# Patient Record
Sex: Male | Born: 1960
Health system: Southern US, Community
[De-identification: ages and names within clinical notes are randomized; demographics above are authoritative.]

## PROBLEM LIST (undated history)

## (undated) DIAGNOSIS — M109 Gout, unspecified: Secondary | ICD-10-CM

## (undated) DIAGNOSIS — I10 Essential (primary) hypertension: Secondary | ICD-10-CM

## (undated) DIAGNOSIS — M1990 Unspecified osteoarthritis, unspecified site: Secondary | ICD-10-CM

## (undated) DIAGNOSIS — E785 Hyperlipidemia, unspecified: Secondary | ICD-10-CM

## (undated) HISTORY — DX: Hyperlipidemia, unspecified: E78.5

## (undated) HISTORY — DX: Essential (primary) hypertension: I10

## (undated) HISTORY — DX: Gout, unspecified: M10.9

## (undated) HISTORY — DX: Unspecified osteoarthritis, unspecified site: M19.90

---

## 2015-07-20 ENCOUNTER — Other Ambulatory Visit: Payer: Self-pay | Admitting: Family Medicine

## 2015-07-20 DIAGNOSIS — N5089 Other specified disorders of the male genital organs: Secondary | ICD-10-CM

## 2015-07-21 LAB — LIPID PANEL
Cholesterol: 243 mg/dL — AB (ref 0–200)
HDL: 200 mg/dL — AB (ref 35–70)
LDL CALC: 165 mg/dL
TRIGLYCERIDES: 174 mg/dL — AB (ref 40–160)

## 2015-07-21 LAB — BASIC METABOLIC PANEL
BUN: 15 mg/dL (ref 4–21)
Creatinine: 1.1 mg/dL (ref 0.6–1.3)
Glucose: 106 mg/dL
Potassium: 4.2 mmol/L (ref 3.4–5.3)
SODIUM: 137 mmol/L (ref 137–147)

## 2015-07-21 LAB — HEPATIC FUNCTION PANEL
ALT: 26 U/L (ref 10–40)
AST: 20 U/L (ref 14–40)
BILIRUBIN, TOTAL: 1.1 mg/dL

## 2015-08-08 ENCOUNTER — Other Ambulatory Visit: Payer: Self-pay | Admitting: Family Medicine

## 2015-08-08 DIAGNOSIS — N5089 Other specified disorders of the male genital organs: Secondary | ICD-10-CM

## 2015-08-11 ENCOUNTER — Ambulatory Visit (INDEPENDENT_AMBULATORY_CARE_PROVIDER_SITE_OTHER): Payer: BLUE CROSS/BLUE SHIELD

## 2015-08-11 ENCOUNTER — Ambulatory Visit: Payer: BLUE CROSS/BLUE SHIELD

## 2015-08-11 DIAGNOSIS — N5089 Other specified disorders of the male genital organs: Secondary | ICD-10-CM

## 2015-08-11 DIAGNOSIS — N433 Hydrocele, unspecified: Secondary | ICD-10-CM | POA: Diagnosis not present

## 2016-01-20 IMAGING — US US ART/VEN ABD/PELV/SCROTUM DOPPLER LTD
1 series · 14 of 25 positions shown · non-contrast
Comparison: No prior.

CLINICAL DATA: Left testicular mass for 2-3 years.

EXAM:
SCROTAL ULTRASOUND
DOPPLER ULTRASOUND OF THE TESTICLES
TECHNIQUE: Complete ultrasound examination of the testicles, epididymis, and
other scrotal structures was performed. Color and spectral Doppler
ultrasound were also utilized to evaluate blood flow to the
testicles.

[Series 1: us art/ven abd/pelv/scrotum doppler ltd · 0.07mm/px · 14 of 76 slices shown]
[im 1/76]
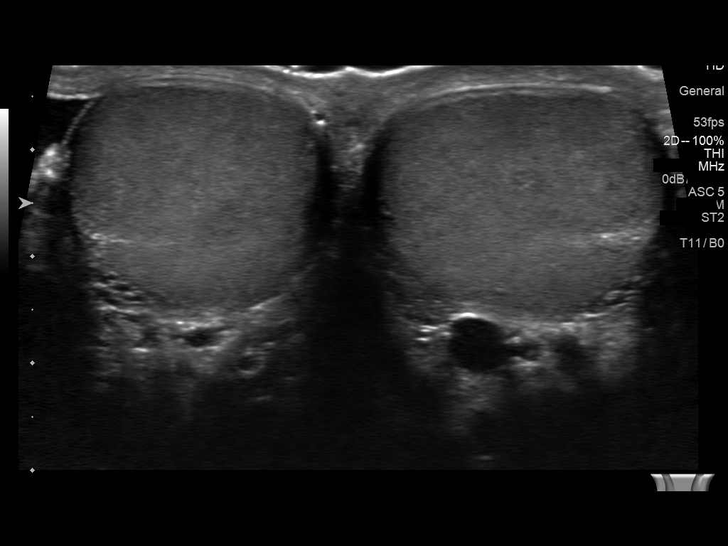
[im 7/76]
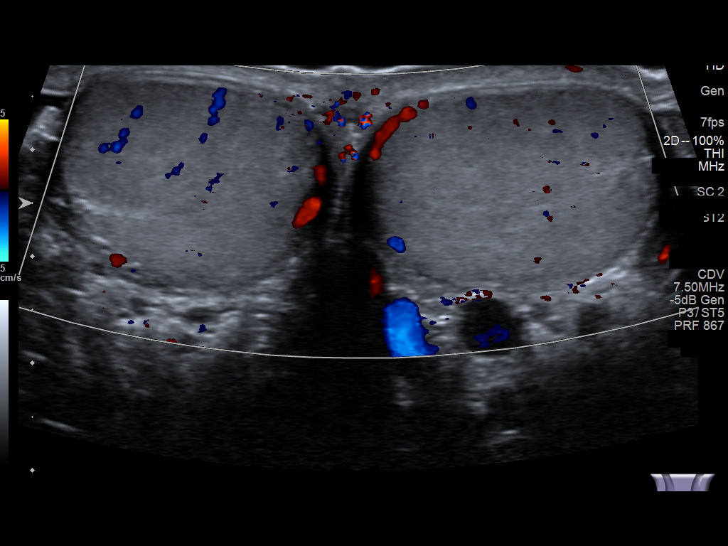
[im 13/76]
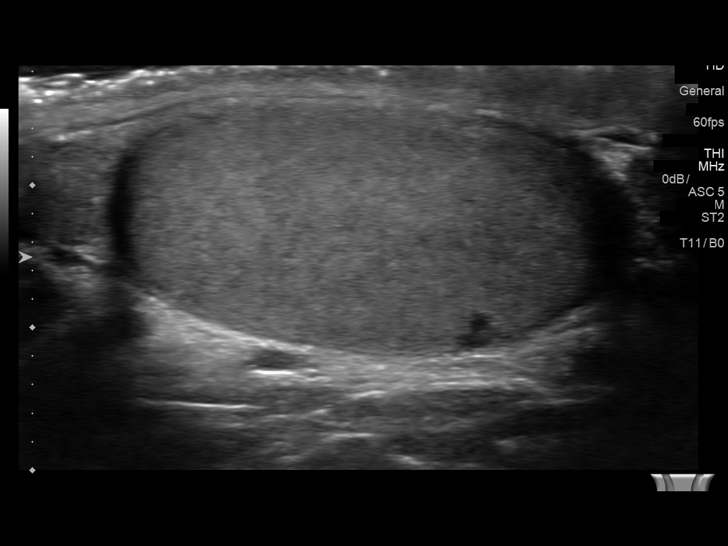
[im 19/76]
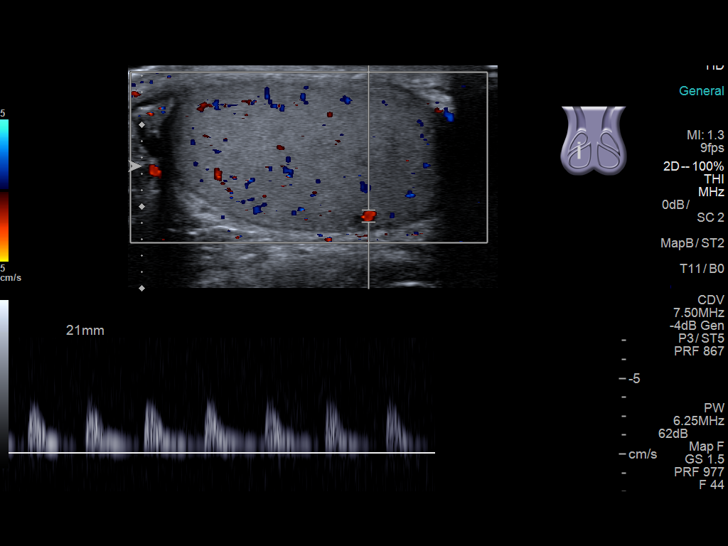
[im 26/76]
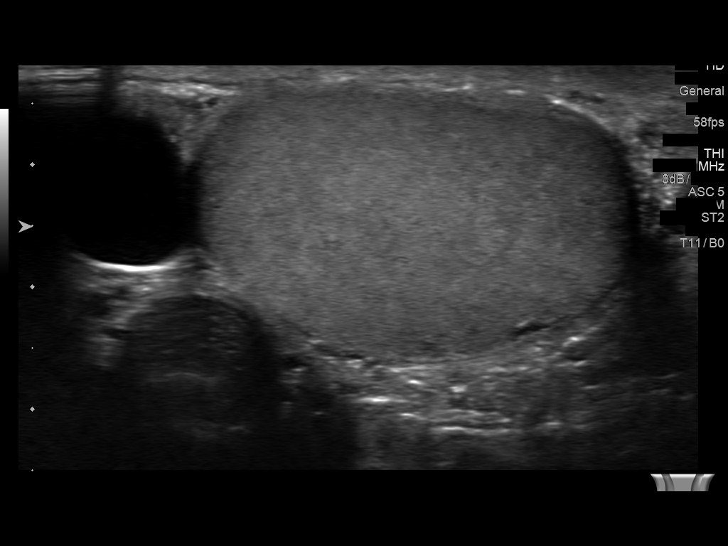
[im 29/76]
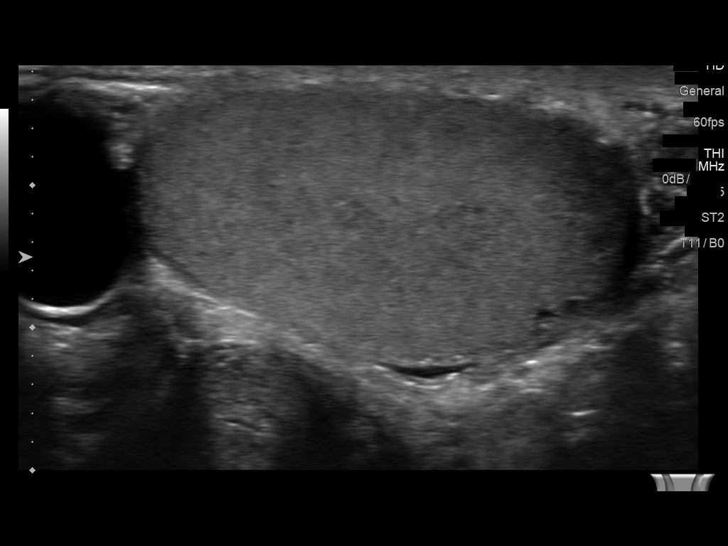
[im 35/76]
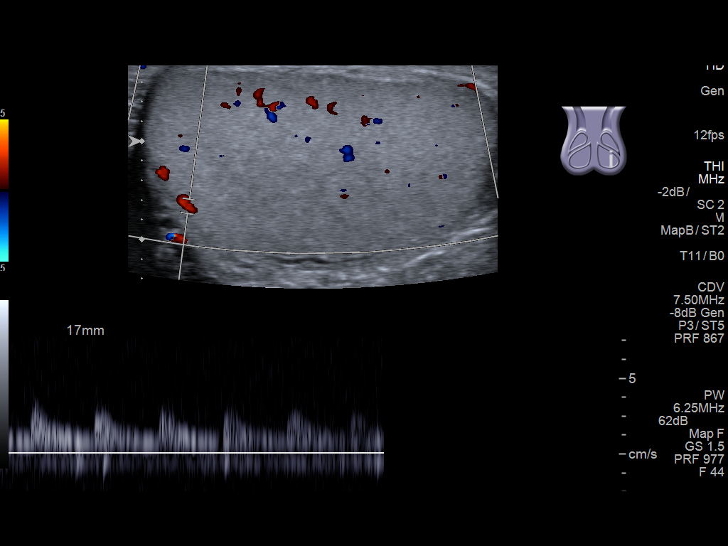
[im 41/76]
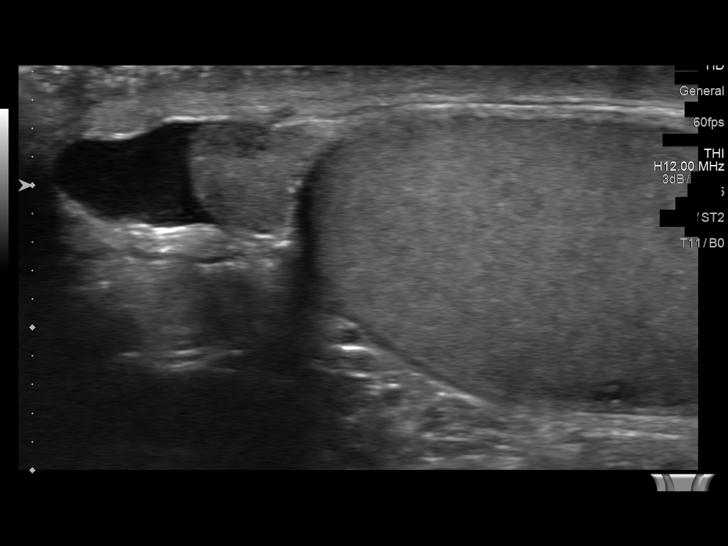
[im 47/76]
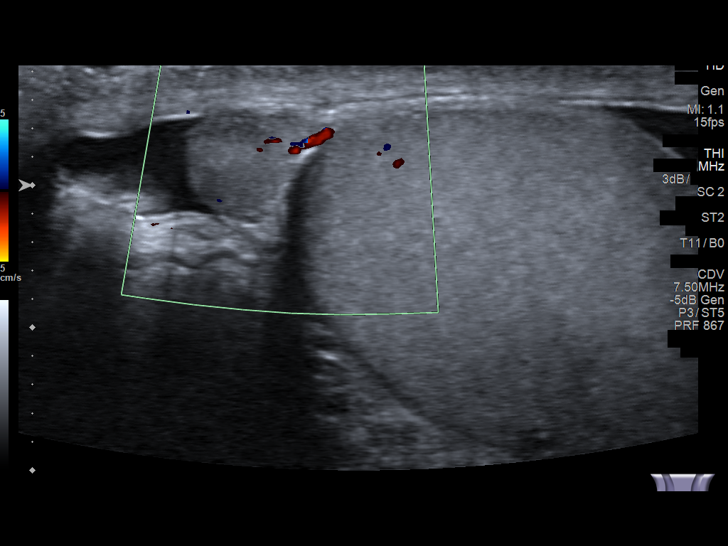
[im 51/76]
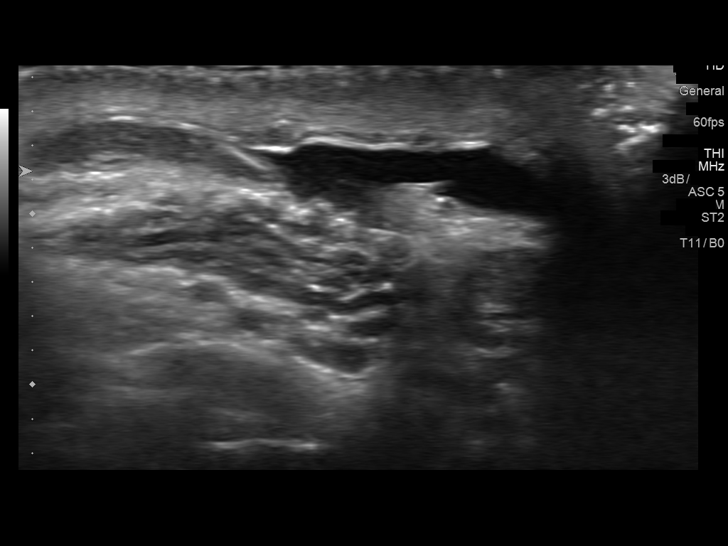
[im 57/76]
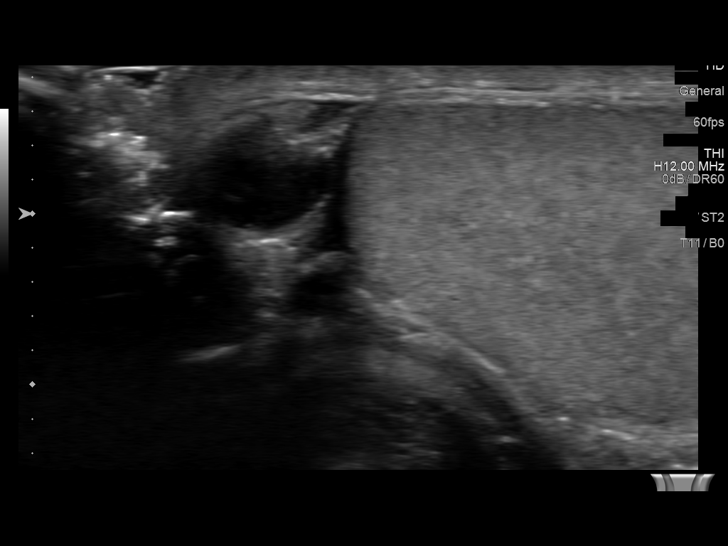
[im 63/76]
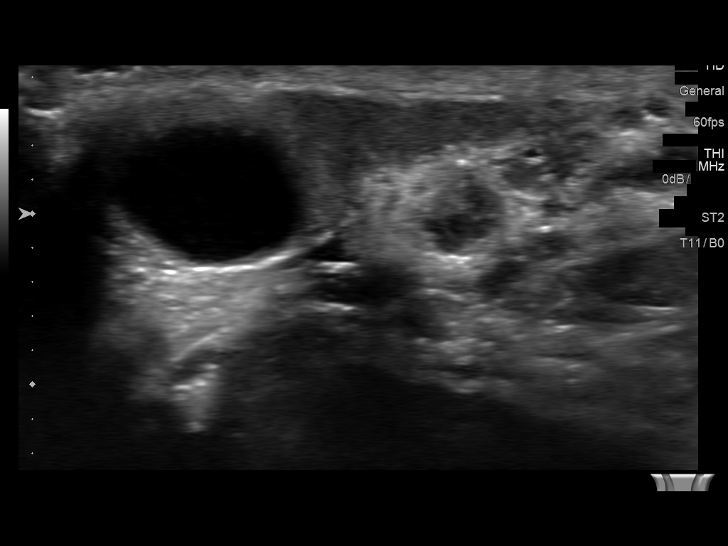
[im 69/76]
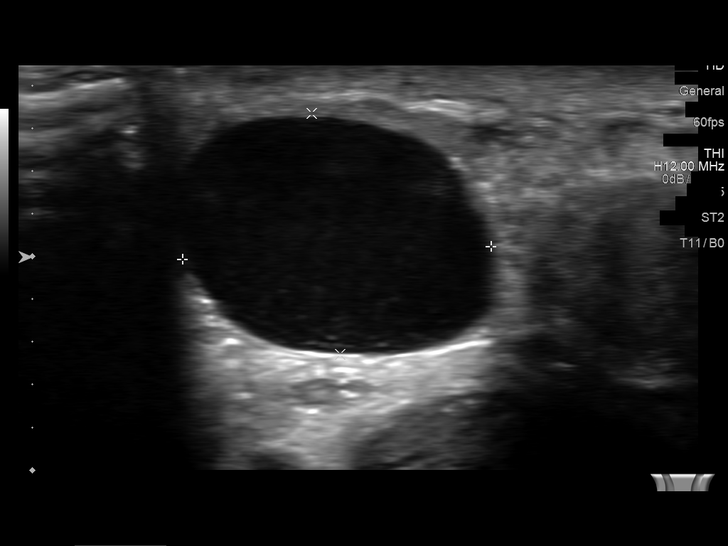
[im 76/76]
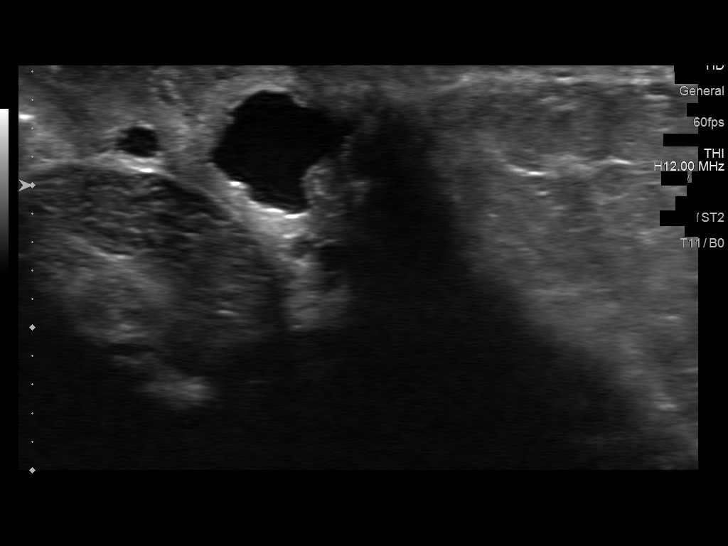

[14 of 25 positions shown; findings below may reference images not displayed]

FINDINGS: Right testicle

Measurements: 3.6 x 2.0 x 2.2 cm. No mass or microlithiasis
visualized.

Left testicle

Measurements: 3.6 x 2.3 x 2.1 cm. No mass or microlithiasis
visualized.

Right epididymis:  Normal in size and appearance.

Left epididymis:  1.5 x 1.1 x 1.4 cm left epididymal cyst.

Hydrocele:  Small hydroceles, right side greater than left.

Varicocele:  None visualized.

Pulsed Doppler interrogation of both testes demonstrates normal low
resistance arterial and venous waveforms bilaterally.
IMPRESSION: 1.  1.5 cm left epididymal cyst.  No evidence of testicular mass.

2.  Small hydroceles, right side greater than left.

## 2016-02-27 ENCOUNTER — Encounter: Payer: Self-pay | Admitting: Family Medicine

## 2016-02-27 ENCOUNTER — Ambulatory Visit (INDEPENDENT_AMBULATORY_CARE_PROVIDER_SITE_OTHER): Payer: BLUE CROSS/BLUE SHIELD | Admitting: Family Medicine

## 2016-02-27 VITALS — BP 148/75 | HR 74 | Temp 97.9°F | Resp 12 | Ht 73.0 in | Wt 209.0 lb

## 2016-02-27 DIAGNOSIS — M10072 Idiopathic gout, left ankle and foot: Secondary | ICD-10-CM | POA: Diagnosis not present

## 2016-02-27 DIAGNOSIS — E785 Hyperlipidemia, unspecified: Secondary | ICD-10-CM | POA: Diagnosis not present

## 2016-02-27 DIAGNOSIS — I10 Essential (primary) hypertension: Secondary | ICD-10-CM

## 2016-02-27 DIAGNOSIS — M109 Gout, unspecified: Secondary | ICD-10-CM

## 2016-02-27 MED ORDER — LOSARTAN POTASSIUM 50 MG PO TABS: 50.0000 mg | ORAL_TABLET | Freq: Every day | ORAL | Status: DC

## 2016-02-27 MED ORDER — ATORVASTATIN CALCIUM 20 MG PO TABS: 20.0000 mg | ORAL_TABLET | Freq: Every day | ORAL | Status: DC

## 2016-02-27 MED ORDER — COLCHICINE 0.6 MG PO TABS: 0.6000 mg | ORAL_TABLET | Freq: Every day | ORAL | Status: DC | PRN

## 2016-02-27 NOTE — Patient Instructions (Addendum)
A few things to remember from today's visit:   1. Hypertension, essential, benign   2. Hyperlipidemia   3. Gouty arthritis of toe of left foot   Blood pressure goal for most people is less than 140/90.Some populations (older than 60) the goal is less than 150/90.  Elevated blood pressure increases the risk of strokes, heart and kidney disease, and eye problems. Regular physical activity and a healthy diet (DASH diet) usually help. Low salt diet. Take medications as instructed. Caution with some over the counter medications as cold medications, dietary products (for weight loss), and Ibuprofen or Aleve (frequent use);all these medications could cause elevation of blood pressure.     If you sign-up for My chart, you can communicate easier with [place] in case you have any question or concern.

## 2016-02-27 NOTE — Progress Notes (Signed)
Pre visit review using our clinic review tool, if applicable. No additional management support is needed unless otherwise documented below in the visit note. 

## 2016-02-27 NOTE — Progress Notes (Signed)
HPI:   Joseph.Joseph Wheeler is a 55 y.o. male, who is here today to establish care with [facility], I have seen Joseph Wheeler while I was with [facility] within the past 2 years.  Last labs about 6 months ago, he cannot acces his portal to check his labs.We have not received records from [facility].  He has no concerns today.  He has Hx of gout, usually he gets a flare up annually, left first MTP joint, last episode was last week. He has not identified precipitating factors. He follows a healthy diet and no recent trauma.  He takes Colchicine as needed.  Hyperlipidemia: He is on Atorvastatin 20 mg daily. Following a low fat diet. He has not noted side effects with medication.    Hypertension: Currently he is on Cozaar 50 mg daily. He is taking medications as instructed, no side effects reported.  He has not noted unusual headache, visual changes, exertional chest pain, dyspnea,  focal weakness, or edema.   Home BP readings < 140/90.  He exercises regularly, cross fit. Has noted some wt gain, has not changed exercise level or diet.    Review of Systems  Constitutional: Negative for fever, activity change, appetite change, fatigue and unexpected weight change.  HENT: Negative for nosebleeds, sore throat and trouble swallowing.   Eyes: Negative for pain, redness and visual disturbance.  Respiratory: Negative for apnea, cough, shortness of breath and wheezing.   Cardiovascular: Negative for chest pain, palpitations and leg swelling.  Gastrointestinal: Negative for nausea, vomiting and abdominal pain.  Endocrine: Negative for cold intolerance, heat intolerance, polydipsia, polyphagia and polyuria.  Genitourinary: Negative for dysuria, hematuria and decreased urine volume.  Musculoskeletal: Positive for arthralgias (intermittent). Negative for myalgias, back pain and gait problem.  Skin: Negative for color change and rash.  Neurological: Negative for dizziness,  seizures, syncope, weakness, numbness and headaches.  Psychiatric/Behavioral: Negative for confusion. The patient is not nervous/anxious.       No current outpatient prescriptions on file prior to visit.   No current facility-administered medications on file prior to visit.     Past Medical History  Diagnosis Date  . Arthritis    Allergies  Allergen Reactions  . Penicillins     Family History  Problem Relation Age of Onset  . Arthritis Mother   . Cancer Father   . Cancer Maternal Grandmother     Social History   Social History  . Marital Status: Unknown    Spouse Name: N/A  . Number of Children: N/A  . Years of Education: N/A   Social History Main Topics  . Smoking status: Never Smoker   . Smokeless tobacco: None  . Alcohol Use: None  . Drug Use: None  . Sexual Activity: Not Asked   Other Topics Concern  . None   Social History Narrative  . None    Filed Vitals:   02/27/16 1352  BP: 148/75  Pulse: 74  Temp: 97.9 F (36.6 C)  Resp: 12    Body mass index is 27.58 kg/(m^2).  SpO2 Readings from Last 3 Encounters:  02/27/16 98%     Physical Exam  Constitutional: He is oriented to person, place, and time. He appears well-developed and well-nourished. No distress.  HENT:  Head: Atraumatic.  Eyes: Conjunctivae and EOM are normal. Pupils are equal, round, and reactive to light.  Neck: No tracheal deviation present. No thyromegaly present.  Cardiovascular: Normal rate and regular rhythm.   No murmur  heard. Pulses:      Posterior tibial pulses are 2+ on the right side, and 2+ on the left side.  Respiratory: Effort normal and breath sounds normal. No respiratory distress.  GI: Soft. He exhibits no mass. There is no tenderness.  Musculoskeletal: He exhibits edema (Pitting 1+ LE edema , bilateral). He exhibits no tenderness.  Lymphadenopathy:    He has no cervical adenopathy.  Neurological: He is alert and oriented to person, place, and time. He has  normal strength. Coordination and gait normal.  Skin: Skin is warm. No erythema.  Psychiatric: He has a normal mood and affect.  Well groomed, good eye contact.      ASSESSMENT AND PLAN:     Joseph Wheeler was seen today for new patient (initial visit) and gout.  Diagnoses and all orders for this visit:  Hypertension, essential, benign  Mildly elevated today but because he is reporting BP readings < 140/90, no changes in current management. DASH diet recommended. Eye exam recommended annually. F/U in 4 months, before if needed.Instructed to bring BP monitor with him.  -     losartan (COZAAR) 50 MG tablet; Take 1 tablet (50 mg total) by mouth daily. -     Comprehensive metabolic panel; Future -     TSH; Future  Hyperlipidemia  No changes in current management, will follow labs done today and will give further recommendations accordingly.  -     atorvastatin (LIPITOR) 20 MG tablet; Take 1 tablet (20 mg total) by mouth daily with supper. -     Comprehensive metabolic panel; Future -     Lipid panel; Future -     TSH; Future  Gouty arthritis of toe of left foot   Stable. Continue dietary recommendations. Colchicine as needed, once annually. F/U as needed.  -     colchicine 0.6 MG tablet; Take 1 tablet (0.6 mg total) by mouth daily as needed. -     Comprehensive metabolic panel; Future -     Uric acid; Future      He is not fasting today, will have labs drown at [place]. Follow up in 4 months but instructed to follow sooner if new concerns arise.     Betty G. Martinique, MD  [facility]. [place] office.

## 2016-03-06 ENCOUNTER — Other Ambulatory Visit (INDEPENDENT_AMBULATORY_CARE_PROVIDER_SITE_OTHER): Payer: BLUE CROSS/BLUE SHIELD

## 2016-03-06 DIAGNOSIS — I10 Essential (primary) hypertension: Secondary | ICD-10-CM | POA: Diagnosis not present

## 2016-03-06 DIAGNOSIS — E785 Hyperlipidemia, unspecified: Secondary | ICD-10-CM

## 2016-03-06 DIAGNOSIS — M109 Gout, unspecified: Secondary | ICD-10-CM

## 2016-03-06 LAB — COMPREHENSIVE METABOLIC PANEL
ALBUMIN: 4.1 g/dL (ref 3.5–5.2)
ALK PHOS: 77 U/L (ref 39–117)
ALT: 35 U/L (ref 0–53)
AST: 31 U/L (ref 0–37)
BILIRUBIN TOTAL: 1.2 mg/dL (ref 0.2–1.2)
BUN: 16 mg/dL (ref 6–23)
CALCIUM: 9.4 mg/dL (ref 8.4–10.5)
CO2: 30 mEq/L (ref 19–32)
Chloride: 102 mEq/L (ref 96–112)
Creatinine, Ser: 1.15 mg/dL (ref 0.40–1.50)
GFR: 69.75 mL/min (ref >60.00)
Glucose, Bld: 95 mg/dL (ref 70–99)
POTASSIUM: 4.4 meq/L (ref 3.5–5.1)
Sodium: 138 mEq/L (ref 135–145)
TOTAL PROTEIN: 7.1 g/dL (ref 6.0–8.3)

## 2016-03-06 LAB — LIPID PANEL
Cholesterol: 159 mg/dL (ref 0–200)
HDL: 45.6 mg/dL (ref >39.00)
LDL Cholesterol: 93 mg/dL (ref 0–99)
NonHDL: 113.62
TRIGLYCERIDES: 105 mg/dL (ref 0.0–149.0)
Total CHOL/HDL Ratio: 3
VLDL: 21 mg/dL (ref 0.0–40.0)

## 2016-03-06 LAB — URIC ACID: Uric Acid, Serum: 9.5 mg/dL — ABNORMAL HIGH (ref 4.0–7.8)

## 2016-03-06 LAB — TSH: TSH: 1.34 u[IU]/mL (ref 0.35–4.50)

## 2016-03-07 ENCOUNTER — Encounter: Payer: Self-pay | Admitting: Family Medicine

## 2016-03-16 ENCOUNTER — Encounter: Payer: Self-pay | Admitting: Family Medicine

## 2016-03-30 ENCOUNTER — Encounter: Payer: Self-pay | Admitting: Family Medicine

## 2016-04-26 ENCOUNTER — Other Ambulatory Visit: Payer: Self-pay

## 2016-04-26 DIAGNOSIS — E785 Hyperlipidemia, unspecified: Secondary | ICD-10-CM

## 2016-04-26 MED ORDER — ATORVASTATIN CALCIUM 20 MG PO TABS: 20.0000 mg | ORAL_TABLET | Freq: Every day | ORAL | 1 refills | Status: DC

## 2016-07-03 ENCOUNTER — Encounter: Payer: Self-pay | Admitting: Family Medicine

## 2016-07-03 ENCOUNTER — Ambulatory Visit (INDEPENDENT_AMBULATORY_CARE_PROVIDER_SITE_OTHER): Payer: BLUE CROSS/BLUE SHIELD | Admitting: Family Medicine

## 2016-07-03 VITALS — BP 118/78 | HR 68 | Resp 12 | Ht 73.0 in | Wt 208.0 lb

## 2016-07-03 DIAGNOSIS — Z23 Encounter for immunization: Secondary | ICD-10-CM | POA: Diagnosis not present

## 2016-07-03 DIAGNOSIS — I10 Essential (primary) hypertension: Secondary | ICD-10-CM

## 2016-07-03 DIAGNOSIS — M109 Gout, unspecified: Secondary | ICD-10-CM | POA: Diagnosis not present

## 2016-07-03 DIAGNOSIS — E785 Hyperlipidemia, unspecified: Secondary | ICD-10-CM

## 2016-07-03 LAB — BASIC METABOLIC PANEL
BUN: 13 mg/dL (ref 6–23)
CO2: 27 meq/L (ref 19–32)
Calcium: 9.9 mg/dL (ref 8.4–10.5)
Chloride: 103 mEq/L (ref 96–112)
Creatinine, Ser: 1.22 mg/dL (ref 0.40–1.50)
GFR: 65.08 mL/min (ref >60.00)
GLUCOSE: 112 mg/dL — AB (ref 70–99)
POTASSIUM: 4.3 meq/L (ref 3.5–5.1)
SODIUM: 139 meq/L (ref 135–145)

## 2016-07-03 MED ORDER — ATORVASTATIN CALCIUM 20 MG PO TABS: 20.0000 mg | ORAL_TABLET | Freq: Every day | ORAL | 3 refills | Status: DC

## 2016-07-03 MED ORDER — LOSARTAN POTASSIUM 50 MG PO TABS: 50.0000 mg | ORAL_TABLET | Freq: Every day | ORAL | 2 refills | Status: DC

## 2016-07-03 NOTE — Progress Notes (Signed)
HPI:   [name] is a 55 y.o. male, who is here today to follow on some of his chronic medical problems.    Hypertension:  Dx a few years ago. Currently on Cozaar 50 mg daily  He denies unusual headache, visual changes, exertional chest pain, dyspnea,  focal weakness, or edema.   Lab Results  Component Value Date   CREATININE 1.15 03/06/2016   BUN 16 03/06/2016   NA 138 03/06/2016   K 4.4 03/06/2016   CL 102 03/06/2016   CO2 30 03/06/2016    He is still exercising regularly, decreased frequency of cross fit from 3 times per week to once per week because work schedule but trying to walk more. He is following a healthful diet.  Home BP < 140/90.  Gout:  He has not had an attack sine his last OV. He is on Colchicine 0.6 mg as needed.  02/2016 uric acid elevated at 9.5. No Hx of nephrolithiasis or urinary symptoms.  New concerns today: None. He needs refills on some his medications. In general he is tolerating medications well.     Review of Systems  Constitutional: Negative for activity change, appetite change, fatigue, fever and unexpected weight change.  HENT: Negative for nosebleeds, sore throat and trouble swallowing.   Eyes: Negative for pain and visual disturbance.  Respiratory: Negative for apnea, cough, shortness of breath and wheezing.   Cardiovascular: Negative for chest pain, palpitations and leg swelling.  Gastrointestinal: Negative for abdominal pain, nausea and vomiting.  Genitourinary: Negative for decreased urine volume, dysuria and hematuria.  Musculoskeletal: Negative for arthralgias, gait problem, joint swelling and myalgias.  Skin: Negative for color change and rash.  Neurological: Negative for dizziness, seizures, weakness, numbness and headaches.  Psychiatric/Behavioral: Negative for confusion. The patient is not nervous/anxious.       Current Outpatient Prescriptions on File Prior to Visit  Medication Sig Dispense  Refill  . colchicine 0.6 MG tablet Take 1 tablet (0.6 mg total) by mouth daily as needed. 30 tablet 2   No current facility-administered medications on file prior to visit.      Past Medical History:  Diagnosis Date  . Arthritis   . Gout   . Hyperlipidemia   . Hypertension    Allergies  Allergen Reactions  . Penicillins     Social History   Social History  . Marital status: Married    Spouse name: N/A  . Number of children: N/A  . Years of education: N/A   Social History Main Topics  . Smoking status: Never Smoker  . Smokeless tobacco: Never Used  . Alcohol use No  . Drug use: No  . Sexual activity: Not Asked   Other Topics Concern  . None   Social History Narrative  . None    Vitals:   07/03/16 0815  BP: 118/78  Pulse: 68  Resp: 12   Body mass index is 27.44 kg/m.   Wt Readings from Last 3 Encounters:  07/03/16 208 lb (94.3 kg)  02/27/16 209 lb (94.8 kg)      Physical Exam  Nursing note and vitals reviewed. Constitutional: He is oriented to person, place, and time. He appears well-developed and well-nourished. No distress.  HENT:  Head: Atraumatic.  Mouth/Throat: Oropharynx is clear and moist and mucous membranes are normal.  Eyes: Conjunctivae and EOM are normal. Pupils are equal, round, and reactive to light.  Neck: No JVD present. No thyroid mass and no thyromegaly  present.  Cardiovascular: Normal rate and regular rhythm.   No murmur heard. Pulses:      Dorsalis pedis pulses are 2+ on the right side, and 2+ on the left side.  Respiratory: Effort normal and breath sounds normal. No respiratory distress.  GI: Soft. He exhibits no mass. There is no hepatomegaly. There is no tenderness.  Musculoskeletal: He exhibits no edema.  Neurological: He is alert and oriented to person, place, and time. He has normal strength. Coordination and gait normal.  Skin: Skin is warm. No erythema.  Psychiatric: He has a normal mood and affect.  Well groomed,  good eye contact.      ASSESSMENT AND PLAN:     Joseph Wheeler was seen today for follow-up.  Diagnoses and all orders for this visit:    Hypertension, essential, benign  Adequately controlled. No changes in current management. DASH-low salt diet recommended. Eye exam recommended annually. F/U in 6-7 months, before if needed.  -     Basic metabolic panel -     losartan (COZAAR) 50 MG tablet; Take 1 tablet (50 mg total) by mouth daily.  Gout, arthropathy  We discussed recommendations in regard to treatment for gout with Allopurinol or Uloric. Uric acid elevated but no symptoms in the past few months, so we will not check lab today and continue current management. We discussed some side effects of medication as well as risk of interaction with statin meds.   Hyperlipidemia, unspecified hyperlipidemia type -     atorvastatin (LIPITOR) 20 MG tablet; Take 1 tablet (20 mg total) by mouth daily with supper.      -[name] was advised to return sooner than planned today if new concerns arise.       Betty G. Martinique, MD  [facility]. [place] office.

## 2016-07-03 NOTE — Patient Instructions (Signed)
A few things to remember from today's visit:   Hypertension, essential, benign - Plan: Basic metabolic panel, losartan (COZAAR) 50 MG tablet  Gout, arthropathy  Hyperlipidemia, unspecified hyperlipidemia type - Plan: atorvastatin (LIPITOR) 20 MG tablet  Goal < 140/90.  Joseph Wheeler, today we have followed on some of your chronic medical problems and they seem to be stable, so no changes in current management today.  Review medication list and be sure if is accurate.  -Remember a healthy diet and regular physical activity are very important for prevention as well as for well being; they also help with many chronic problems, decreasing the need of adding new medications and delaying or preventing possible complications.  Remember to arrange your follow up appt before leaving today. Please follow sooner than planned if a new concern arises.

## 2016-07-03 NOTE — Progress Notes (Signed)
Pre visit review using our clinic review tool, if applicable. No additional management support is needed unless otherwise documented below in the visit note. 

## 2016-07-04 ENCOUNTER — Encounter: Payer: Self-pay | Admitting: Family Medicine

## 2016-10-02 ENCOUNTER — Encounter: Payer: BLUE CROSS/BLUE SHIELD | Admitting: Family Medicine

## 2017-01-21 ENCOUNTER — Encounter: Payer: Self-pay | Admitting: Family Medicine

## 2017-01-21 NOTE — Progress Notes (Signed)
HPI:  [name] is a 56 y.o.male here today for his routine physical examination.  Last f/u appt on 07/03/16.   He lives with his wife.  Regular exercise 3 or more times per week: Yes Following a healthy diet: Yes   Chronic medical problems: HLD,gout,HTN  He is currently on Atorvastatin 20 mg daily.   Lab Results  Component Value Date   CHOL 159 03/06/2016   HDL 45.60 03/06/2016   LDLCALC 93 03/06/2016   TRIG 105.0 03/06/2016   CHOLHDL 3 03/06/2016   HTN: BP readings at home: 120-130's/70's.  Currently he is on Losartan 50 mg daily. Denies severe/frequent headache, visual changes, chest pain, dyspnea, palpitation, claudication, focal weakness, or edema.  Gout: He takes Colchicine 0.6 mg daily as needed. He has not had a flare up in a year.   Hx of STD's: Denies  Immunization History  Administered Date(s) Administered  . Influenza,inj,Quad PF,36+ Mos 07/03/2016  Tdap vaccine 2014. -Hep C screening: Not sure about having it before, denies risk factors.  Last eye exam 07/2016. Last colon cancer screening: 2015, 10 years f/u recommended. Last prostate ca screening: 2016 wnl.  -Denies high alcohol intake, tobacco use, or Hx of illicit drug use.  He has no concerns today.   Review of Systems  Constitutional: Negative for activity change, appetite change, fatigue, fever and unexpected weight change.  HENT: Positive for tinnitus (bilateral for years.). Negative for dental problem, hearing loss, nosebleeds, sore throat, trouble swallowing and voice change.   Eyes: Negative for redness and visual disturbance.  Respiratory: Negative for apnea, cough, shortness of breath and wheezing.   Cardiovascular: Negative for chest pain, palpitations and leg swelling.  Gastrointestinal: Negative for abdominal pain, blood in stool, nausea and vomiting.  Endocrine: Negative for polydipsia and polyuria.  Genitourinary: Negative for decreased urine volume,  dysuria, genital sores, hematuria and testicular pain.  Musculoskeletal: Positive for arthralgias (Left knee pain, "not debilitating"). Negative for back pain, joint swelling and myalgias.  Skin: Negative for color change and rash.  Neurological: Negative for dizziness, seizures, syncope, weakness, numbness and headaches.  Hematological: Negative for adenopathy. Does not bruise/bleed easily.  Psychiatric/Behavioral: Negative for confusion and sleep disturbance. The patient is not nervous/anxious.   All other systems reviewed and are negative.    Current Outpatient Prescriptions on File Prior to Visit  Medication Sig Dispense Refill  . colchicine 0.6 MG tablet Take 1 tablet (0.6 mg total) by mouth daily as needed. 30 tablet 2   No current facility-administered medications on file prior to visit.      Past Medical History:  Diagnosis Date  . Arthritis   . Gout   . Hyperlipidemia   . Hypertension     Allergies  Allergen Reactions  . Penicillins     Family History  Problem Relation Age of Onset  . Arthritis Mother   . Diabetes Mother   . Cancer Father        prostate  . Hypertension Father   . Hyperlipidemia Father   . Cancer Paternal Grandmother        breast.    Social History   Social History  . Marital status: Married    Spouse name: N/A  . Number of children: N/A  . Years of education: N/A   Social History Main Topics  . Smoking status: Never Smoker  . Smokeless tobacco: Never Used  . Alcohol use No  . Drug use: No  . Sexual activity:  Not Asked   Other Topics Concern  . None   Social History Narrative  . None     Vitals:   01/22/17 0745  BP: 118/78  Pulse: 77  Resp: 12   Body mass index is 26.98 kg/m.  O2 sat at RA 97%  Wt Readings from Last 3 Encounters:  01/22/17 204 lb 8 oz (92.8 kg)  07/03/16 208 lb (94.3 kg)  02/27/16 209 lb (94.8 kg)    Physical Exam  Nursing note and vitals reviewed. Constitutional: He is oriented to person,  place, and time. He appears well-developed and well-nourished. No distress.  HENT:  Head: Atraumatic.  Right Ear: Hearing, external ear and ear canal normal. Tympanic membrane is scarred (minimal).  Left Ear: Hearing, tympanic membrane, external ear and ear canal normal.  Mouth/Throat: Oropharynx is clear and moist and mucous membranes are normal.  Eyes: Conjunctivae and EOM are normal. Pupils are equal, round, and reactive to light.  Neck: Normal range of motion. No tracheal deviation present. No thyromegaly present.  Cardiovascular: Normal rate and regular rhythm.   No murmur heard. Pulses:      Dorsalis pedis pulses are 2+ on the right side, and 2+ on the left side.  Respiratory: Effort normal and breath sounds normal. No respiratory distress.  GI: Soft. He exhibits no mass. There is no tenderness.  Genitourinary:  Genitourinary Comments: no concerns.  Musculoskeletal: He exhibits no edema or tenderness.  No major deformities appreciated and no signs of synovitis.  Lymphadenopathy:    He has no cervical adenopathy.       Right: No supraclavicular adenopathy present.       Left: No supraclavicular adenopathy present.  Neurological: He is alert and oriented to person, place, and time. He has normal strength. No cranial nerve deficit or sensory deficit. Coordination and gait normal.  Reflex Scores:      Bicep reflexes are 2+ on the right side and 2+ on the left side.      Patellar reflexes are 2+ on the right side and 2+ on the left side. Skin: Skin is warm. No erythema.  Psychiatric: He has a normal mood and affect. Cognition and memory are normal.  Well groomed, good eye contact.     ASSESSMENT AND PLAN:   Discussed the following assessment and plan:   Joseph Wheeler was seen today for annual exam.  Diagnoses and all orders for this visit:  Lab Results  Component Value Date   CHOL 144 01/22/2017   HDL 41.80 01/22/2017   LDLCALC 83 01/22/2017   TRIG 94.0 01/22/2017   CHOLHDL  3 01/22/2017     Chemistry      Component Value Date/Time   NA 140 01/22/2017 0831   NA 137 07/21/2015   K 4.6 01/22/2017 0831   CL 105 01/22/2017 0831   CO2 29 01/22/2017 0831   BUN 14 01/22/2017 0831   BUN 15 07/21/2015   CREATININE 1.22 01/22/2017 0831   GLU 106 07/21/2015      Component Value Date/Time   CALCIUM 9.4 01/22/2017 0831   ALKPHOS 77 01/22/2017 0831   AST 24 01/22/2017 0831   ALT 27 01/22/2017 0831   BILITOT 1.3 (H) 01/22/2017 0831     Lab Results  Component Value Date   PSA 0.64 01/22/2017   Lab Results  Component Value Date   HGBA1C 5.7 01/22/2017   Lab Results  Component Value Date   LABURIC 7.7 01/22/2017    Routine general medical examination at  a health care facility   We discussed the importance of regular physical activity and healthy diet for prevention of chronic illness and/or complications. Preventive guidelines reviewed. Vaccination up to date.  Next CPE in 1 year.   -     Comprehensive metabolic panel -     Hemoglobin A1c  Hyperlipidemia, unspecified hyperlipidemia type  No changes in current management, will follow labs done today and will give further recommendations accordingly. F/U in 6-12 months.  -     Lipid panel -     atorvastatin (LIPITOR) 20 MG tablet; Take 1 tablet (20 mg total) by mouth daily with supper.  Hypertension, essential, benign  Adequately controlled. No changes in current management. DASH-low salt diet recommended.  F/U in 6 months, before if needed.  -     losartan (COZAAR) 50 MG tablet; Take 1 tablet (50 mg total) by mouth daily.  Encounter for hepatitis C screening test for low risk patient -     Hepatitis C antibody screen  Gouty arthritis of toe of left foot  Asymptomatic. No changes in current management, will follow labs done today and will give further recommendations accordingly.  -     Uric acid  Prostate cancer screening -     PSA(Must document that pt has been informed of  limitations of PSA testing.)    Return in 6 months (on 07/25/2017) for HTN.     Lilyanah Celestin G. Martinique, MD  [facility]. [place] office.

## 2017-01-22 ENCOUNTER — Encounter: Payer: Self-pay | Admitting: Family Medicine

## 2017-01-22 ENCOUNTER — Ambulatory Visit (INDEPENDENT_AMBULATORY_CARE_PROVIDER_SITE_OTHER): Payer: BLUE CROSS/BLUE SHIELD | Admitting: Family Medicine

## 2017-01-22 VITALS — BP 118/78 | HR 77 | Resp 12 | Ht 73.0 in | Wt 204.5 lb

## 2017-01-22 DIAGNOSIS — E785 Hyperlipidemia, unspecified: Secondary | ICD-10-CM

## 2017-01-22 DIAGNOSIS — Z125 Encounter for screening for malignant neoplasm of prostate: Secondary | ICD-10-CM | POA: Diagnosis not present

## 2017-01-22 DIAGNOSIS — M109 Gout, unspecified: Secondary | ICD-10-CM | POA: Diagnosis not present

## 2017-01-22 DIAGNOSIS — Z1159 Encounter for screening for other viral diseases: Secondary | ICD-10-CM | POA: Diagnosis not present

## 2017-01-22 DIAGNOSIS — I10 Essential (primary) hypertension: Secondary | ICD-10-CM | POA: Diagnosis not present

## 2017-01-22 DIAGNOSIS — Z0000 Encounter for general adult medical examination without abnormal findings: Secondary | ICD-10-CM | POA: Diagnosis not present

## 2017-01-22 LAB — COMPREHENSIVE METABOLIC PANEL
ALBUMIN: 4.4 g/dL (ref 3.5–5.2)
ALK PHOS: 77 U/L (ref 39–117)
ALT: 27 U/L (ref 0–53)
AST: 24 U/L (ref 0–37)
BILIRUBIN TOTAL: 1.3 mg/dL — AB (ref 0.2–1.2)
BUN: 14 mg/dL (ref 6–23)
CALCIUM: 9.4 mg/dL (ref 8.4–10.5)
CO2: 29 meq/L (ref 19–32)
Chloride: 105 mEq/L (ref 96–112)
Creatinine, Ser: 1.22 mg/dL (ref 0.40–1.50)
GFR: 64.95 mL/min (ref >60.00)
Glucose, Bld: 117 mg/dL — ABNORMAL HIGH (ref 70–99)
Potassium: 4.6 mEq/L (ref 3.5–5.1)
Sodium: 140 mEq/L (ref 135–145)
Total Protein: 7 g/dL (ref 6.0–8.3)

## 2017-01-22 LAB — LIPID PANEL
CHOLESTEROL: 144 mg/dL (ref 0–200)
HDL: 41.8 mg/dL (ref >39.00)
LDL CALC: 83 mg/dL (ref 0–99)
NonHDL: 101.9
TRIGLYCERIDES: 94 mg/dL (ref 0.0–149.0)
Total CHOL/HDL Ratio: 3
VLDL: 18.8 mg/dL (ref 0.0–40.0)

## 2017-01-22 LAB — PSA: PSA: 0.64 ng/mL (ref 0.10–4.00)

## 2017-01-22 LAB — HEMOGLOBIN A1C: HEMOGLOBIN A1C: 5.7 % (ref 4.6–6.5)

## 2017-01-22 LAB — URIC ACID: URIC ACID, SERUM: 7.7 mg/dL (ref 4.0–7.8)

## 2017-01-22 MED ORDER — LOSARTAN POTASSIUM 50 MG PO TABS: 50.0000 mg | ORAL_TABLET | Freq: Every day | ORAL | 2 refills | Status: DC

## 2017-01-22 MED ORDER — ATORVASTATIN CALCIUM 20 MG PO TABS
20.0000 mg | ORAL_TABLET | Freq: Every day | ORAL | 3 refills | Status: DC
Start: 2017-01-22 — End: 2018-01-24

## 2017-01-22 NOTE — Patient Instructions (Signed)
A few things to remember from today's visit:   Hyperlipidemia, unspecified hyperlipidemia type - Plan: Lipid panel, atorvastatin (LIPITOR) 20 MG tablet  Hypertension, essential, benign - Plan: losartan (COZAAR) 50 MG tablet  Encounter for hepatitis C screening test for low risk patient - Plan: Hepatitis C antibody screen  Gouty arthritis of toe of left foot - Plan: Uric acid  Prostate cancer screening - Plan: PSA(Must document that pt has been informed of limitations of PSA testing.)  Routine general medical examination at a health care facility - Plan: Comprehensive metabolic panel, Hemoglobin A1c    At least 150 minutes of moderate exercise per week, daily brisk walking for 15-30 min is a good exercise option. Healthy diet low in saturated (animal) fats and sweets and consisting of fresh fruits and vegetables, lean meats such as fish and white chicken and whole grains.  - Vaccines:  Tdap vaccine every 10 years.  Shingles vaccine recommended at age 69, could be given after 58 years of age but not sure about insurance coverage.  Pneumonia vaccines:  Prevnar 75 at 22 and Pneumovax at 27.   -Screening recommendations for low/normal risk males:  Screening for diabetes at age 17-45 and every 3 years.     Colonoscopy for colon cancer screening at age 15 and until age 48.  Prostate cancer screening: some controversy.        Please be sure medication list is accurate. If a new problem present, please set up appointment sooner than planned today.

## 2017-01-23 ENCOUNTER — Encounter: Payer: Self-pay | Admitting: Family Medicine

## 2017-01-23 LAB — HEPATITIS C ANTIBODY: HCV AB: NEGATIVE

## 2017-01-30 ENCOUNTER — Encounter: Payer: BLUE CROSS/BLUE SHIELD | Admitting: Family Medicine

## 2017-05-30 ENCOUNTER — Encounter: Payer: Self-pay | Admitting: Family Medicine

## 2017-07-25 DIAGNOSIS — R7301 Impaired fasting glucose: Secondary | ICD-10-CM | POA: Insufficient documentation

## 2017-07-25 NOTE — Progress Notes (Signed)
HPI:   [name] is a 56 y.o. male, who is here today to follow on some chronic medical problems.  He was last seen on 01/22/17.  Hypertension:   Currently on Cozaar 50 mg daily.   BP readings at home: 120's/70's. Last eye exam 08/2016.  He is taking medications as instructed, no side effects reported.  He has not noted headache, visual changes, exertional chest pain, dyspnea,  focal weakness, or edema.   Lab Results  Component Value Date   CREATININE 1.22 01/22/2017   BUN 14 01/22/2017   NA 140 01/22/2017   K 4.6 01/22/2017   CL 105 01/22/2017   CO2 29 01/22/2017     FG 117. Lab Results  Component Value Date   HGBA1C 5.7 01/22/2017   In 01/2017 total bili was slightly elevated. He denies changes in appetite, abnormal weight loss, abdominal pain, changes in urine/stool color, or jaundice. He denies high alcohol consumption.  He is exercising once per week for an hour, circuit training.   He has no new concerns today.   Review of Systems  Constitutional: Negative for activity change, appetite change, fatigue, fever and unexpected weight change.  HENT: Negative for nosebleeds, sore throat and trouble swallowing.   Eyes: Negative for redness and visual disturbance.  Respiratory: Negative for apnea, cough, shortness of breath and wheezing.   Cardiovascular: Negative for chest pain, palpitations and leg swelling.  Gastrointestinal: Negative for abdominal pain, nausea and vomiting.  Endocrine: Negative for cold intolerance and heat intolerance.  Genitourinary: Negative for decreased urine volume, dysuria and hematuria.  Musculoskeletal: Negative for gait problem and myalgias.  Skin: Negative for pallor and rash.  Neurological: Negative for syncope, weakness, numbness and headaches.  Psychiatric/Behavioral: Negative for confusion. The patient is not nervous/anxious.       Current Outpatient Medications on File Prior to Visit  Medication  Sig Dispense Refill  . atorvastatin (LIPITOR) 20 MG tablet Take 1 tablet (20 mg total) by mouth daily with supper. 90 tablet 3  . colchicine 0.6 MG tablet Take 1 tablet (0.6 mg total) by mouth daily as needed. 30 tablet 2   No current facility-administered medications on file prior to visit.      Past Medical History:  Diagnosis Date  . Arthritis   . Gout   . Hyperlipidemia   . Hypertension    Allergies  Allergen Reactions  . Penicillins     Social History   Socioeconomic History  . Marital status: Married    Spouse name: None  . Number of children: None  . Years of education: None  . Highest education level: None  Social Needs  . Financial resource strain: None  . Food insecurity - worry: None  . Food insecurity - inability: None  . Transportation needs - medical: None  . Transportation needs - non-medical: None  Occupational History  . None  Tobacco Use  . Smoking status: Never Smoker  . Smokeless tobacco: Never Used  Substance and Sexual Activity  . Alcohol use: No    Alcohol/week: 0.0 oz  . Drug use: No  . Sexual activity: None  Other Topics Concern  . None  Social History Narrative  . None    Vitals:   07/26/17 0813  BP: 122/88  Pulse: 72  Resp: 16  Temp: 97.7 F (36.5 C)  SpO2: 98%   Body mass index is 27.11 kg/m.    Physical Exam  Nursing note and vitals reviewed. Constitutional:  He is oriented to person, place, and time. He appears well-developed. No distress.  HENT:  Head: Normocephalic and atraumatic.  Mouth/Throat: Oropharynx is clear and moist and mucous membranes are normal.  Eyes: Conjunctivae are normal. Pupils are equal, round, and reactive to light.  Cardiovascular: Normal rate and regular rhythm.  No murmur heard. Pulses:      Dorsalis pedis pulses are 2+ on the right side, and 2+ on the left side.  Respiratory: Effort normal and breath sounds normal. No respiratory distress.  GI: Soft. He exhibits no mass. There is no  hepatomegaly. There is no tenderness.  Musculoskeletal: He exhibits no edema or tenderness.  Lymphadenopathy:    He has no cervical adenopathy.  Neurological: He is alert and oriented to person, place, and time. He has normal strength. Gait normal.  Skin: Skin is warm. No rash noted. No erythema.  Psychiatric: He has a normal mood and affect. Cognition and memory are normal.  Well groomed, good eye contact.    ASSESSMENT AND PLAN:   [name] was seen today for follow-up.  Diagnoses and all orders for this visit:  Hypertension, essential, benign  Adequately controlled. No changes in current management. DASH-low salt diet to continue. Eye exam is current, he has appointment next month. F/U in 6 months, before if needed.  -     Basic metabolic panel -     losartan (COZAAR) 50 MG tablet; Take 1 tablet (50 mg total) daily by mouth.  Elevated bilirubin  Mild. We discussed possible causes. Further recommendations will be given according to lab results.  -     Hepatic function panel -     CBC  IFG (impaired fasting glucose)  Continue healthy lifestyle for primary prevention. Further recommendations will be given according to A1c results.  -     Hemoglobin A1c     -[name] was advised to return sooner than planned today if new concerns arise.       Betty G. Martinique, MD  [facility]. [place] office.

## 2017-07-26 ENCOUNTER — Encounter: Payer: Self-pay | Admitting: Family Medicine

## 2017-07-26 ENCOUNTER — Ambulatory Visit (INDEPENDENT_AMBULATORY_CARE_PROVIDER_SITE_OTHER): Payer: BLUE CROSS/BLUE SHIELD | Admitting: Family Medicine

## 2017-07-26 VITALS — BP 122/88 | HR 72 | Temp 97.7°F | Resp 16 | Ht 73.0 in | Wt 205.5 lb

## 2017-07-26 DIAGNOSIS — I10 Essential (primary) hypertension: Secondary | ICD-10-CM | POA: Diagnosis not present

## 2017-07-26 DIAGNOSIS — R17 Unspecified jaundice: Secondary | ICD-10-CM | POA: Diagnosis not present

## 2017-07-26 DIAGNOSIS — R7301 Impaired fasting glucose: Secondary | ICD-10-CM

## 2017-07-26 LAB — HEPATIC FUNCTION PANEL
ALBUMIN: 4.2 g/dL (ref 3.5–5.2)
ALT: 25 U/L (ref 0–53)
AST: 22 U/L (ref 0–37)
Alkaline Phosphatase: 77 U/L (ref 39–117)
Bilirubin, Direct: 0.2 mg/dL (ref 0.0–0.3)
Total Bilirubin: 1.2 mg/dL (ref 0.2–1.2)
Total Protein: 7.2 g/dL (ref 6.0–8.3)

## 2017-07-26 LAB — BASIC METABOLIC PANEL
BUN: 17 mg/dL (ref 6–23)
CALCIUM: 9.5 mg/dL (ref 8.4–10.5)
CO2: 26 mEq/L (ref 19–32)
Chloride: 103 mEq/L (ref 96–112)
Creatinine, Ser: 1.25 mg/dL (ref 0.40–1.50)
GFR: 63.04 mL/min (ref >60.00)
GLUCOSE: 106 mg/dL — AB (ref 70–99)
Potassium: 4.2 mEq/L (ref 3.5–5.1)
SODIUM: 139 meq/L (ref 135–145)

## 2017-07-26 LAB — CBC
HCT: 49 % (ref 39.0–52.0)
Hemoglobin: 16.4 g/dL (ref 13.0–17.0)
MCHC: 33.5 g/dL (ref 30.0–36.0)
MCV: 92.3 fl (ref 78.0–100.0)
PLATELETS: 207 10*3/uL (ref 150.0–400.0)
RBC: 5.31 Mil/uL (ref 4.22–5.81)
RDW: 12.2 % (ref 11.5–15.5)
WBC: 5.4 10*3/uL (ref 4.0–10.5)

## 2017-07-26 LAB — HEMOGLOBIN A1C: HEMOGLOBIN A1C: 5.6 % (ref 4.6–6.5)

## 2017-07-26 MED ORDER — LOSARTAN POTASSIUM 50 MG PO TABS: 50.0000 mg | ORAL_TABLET | Freq: Every day | ORAL | 2 refills | Status: DC

## 2017-07-26 NOTE — Patient Instructions (Addendum)
A few things to remember from today's visit:   Hypertension, essential, benign - Plan: Basic metabolic panel, losartan (COZAAR) 50 MG tablet  Elevated bilirubin - Plan: Hepatic function panel, CBC  IFG (impaired fasting glucose) - Plan: Hemoglobin A1c  No changes today.   Please be sure medication list is accurate. If a new problem present, please set up appointment sooner than planned today.

## 2017-07-28 ENCOUNTER — Encounter: Payer: Self-pay | Admitting: Family Medicine

## 2017-08-11 ENCOUNTER — Encounter: Payer: Self-pay | Admitting: Family Medicine

## 2018-01-23 NOTE — Progress Notes (Addendum)
HPI:  [name] is a 57 y.o.male here today for his routine physical examination.  Last CPE: 01/22/17. He lives with his wife.  Regular exercise 3 or more times per week: Once per week but planning on going back to 2 times per week, moderate exercise for 1 hour.  Following a healthy diet: Yes   Chronic medical problems: HTN,HLD,gout He has not had episodes of gout in over a year.  HLD: On Atorvastatin 20 mg daily. He follows a low-fat diet. Tolerating medication well.  Lab Results  Component Value Date   CHOL 144 01/22/2017   HDL 41.80 01/22/2017   LDLCALC 83 01/22/2017   TRIG 94.0 01/22/2017   CHOLHDL 3 01/22/2017   Hypertension, currently on Losartan 50 mg daily. Home BP < 130/80. Last eye exam within a year, he has an appointment coming.  Negative for severe/frequent headache, visual changes, chest pain, dyspnea, palpitation, claudication, focal weakness, or edema.  Lab Results  Component Value Date   CREATININE 1.25 07/26/2017   BUN 17 07/26/2017   NA 139 07/26/2017   K 4.2 07/26/2017   CL 103 07/26/2017   CO2 26 07/26/2017     Hx of STD's: Negative.  Immunization History  Administered Date(s) Administered  . Influenza,inj,Quad PF,6+ Mos 07/03/2016    -Hep C screening: 01/2017.   Last colon cancer screening: 2015,due in 2025. Last prostate ca screening:   Lab Results  Component Value Date   PSA 0.64 01/22/2017   History of epididymal cyst in the right testicle, he has been monitoring it and reporting no changes.  No testicular pain or swelling.  Negative for dysuria,increased urinary frequency, gross hematuria,or decreased urine output.  Testicular US in 08/2015 show 1 point centimeter left epididymal cyst, no evidence of testicular mass.  Small hydroceles, right greater than left.  -No high alcohol intake, tobacco use, or Hx of illicit drug use.  He has no concerns today.    Review of Systems  Constitutional:  Negative for activity change, appetite change, fatigue, fever and unexpected weight change.  HENT: Negative for dental problem, nosebleeds, sore throat, trouble swallowing and voice change.   Eyes: Negative for redness and visual disturbance.  Respiratory: Negative for apnea, cough, shortness of breath and wheezing.   Cardiovascular: Negative for chest pain, palpitations and leg swelling.  Gastrointestinal: Negative for abdominal pain, blood in stool, nausea and vomiting.       No changes in bowel habits.  Endocrine: Negative for cold intolerance, heat intolerance, polydipsia, polyphagia and polyuria.  Genitourinary: Negative for decreased urine volume, dysuria, genital sores, hematuria and testicular pain.  Musculoskeletal: Negative for gait problem and myalgias.  Skin: Negative for color change and rash.  Allergic/Immunologic: Negative for environmental allergies.  Neurological: Negative for syncope, weakness, numbness and headaches.  Hematological: Negative for adenopathy. Does not bruise/bleed easily.  Psychiatric/Behavioral: Negative for confusion and sleep disturbance. The patient is not nervous/anxious.   All other systems reviewed and are negative.    Current Outpatient Medications on File Prior to Visit  Medication Sig Dispense Refill  . atorvastatin (LIPITOR) 20 MG tablet Take 1 tablet (20 mg total) by mouth daily with supper. 90 tablet 3  . colchicine 0.6 MG tablet Take 1 tablet (0.6 mg total) by mouth daily as needed. 30 tablet 2  . losartan (COZAAR) 50 MG tablet Take 1 tablet (50 mg total) daily by mouth. 90 tablet 2   No current facility-administered medications on file prior to visit.  Past Medical History:  Diagnosis Date  . Arthritis   . Gout   . Hyperlipidemia   . Hypertension     History reviewed. No pertinent surgical history.  Allergies  Allergen Reactions  . Penicillins     Family History  Problem Relation Age of Onset  . Arthritis Mother   .  Diabetes Mother   . Cancer Father        prostate  . Hypertension Father   . Hyperlipidemia Father   . Cancer Paternal Grandmother        breast.    Social History   Socioeconomic History  . Marital status: Married    Spouse name: Not on file  . Number of children: Not on file  . Years of education: Not on file  . Highest education level: Not on file  Occupational History  . Not on file  Social Needs  . Financial resource strain: Not on file  . Food insecurity:    Worry: Not on file    Inability: Not on file  . Transportation needs:    Medical: Not on file    Non-medical: Not on file  Tobacco Use  . Smoking status: Never Smoker  . Smokeless tobacco: Never Used  Substance and Sexual Activity  . Alcohol use: No    Alcohol/week: 0.0 oz  . Drug use: No  . Sexual activity: Not on file  Lifestyle  . Physical activity:    Days per week: Not on file    Minutes per session: Not on file  . Stress: Not on file  Relationships  . Social connections:    Talks on phone: Not on file    Gets together: Not on file    Attends religious service: Not on file    Active member of club or organization: Not on file    Attends meetings of clubs or organizations: Not on file    Relationship status: Not on file  Other Topics Concern  . Not on file  Social History Narrative  . Not on file     Vitals:   01/24/18 0808  BP: 134/82  Pulse: 73  Resp: 12  Temp: 97.8 F (36.6 C)  SpO2: 96%   Body mass index is 27.36 kg/m.   Wt Readings from Last 3 Encounters:  01/24/18 207 lb 6 oz (94.1 kg)  07/26/17 205 lb 8 oz (93.2 kg)  01/22/17 204 lb 8 oz (92.8 kg)    Physical Exam  Nursing note and vitals reviewed. Constitutional: He is oriented to person, place, and time. He appears well-developed. No distress.  HENT:  Head: Normocephalic and atraumatic.  Right Ear: Hearing, external ear and ear canal normal. Tympanic membrane is scarred (small).  Left Ear: Hearing, tympanic  membrane, external ear and ear canal normal.  Mouth/Throat: Oropharynx is clear and moist and mucous membranes are normal.  Eyes: Pupils are equal, round, and reactive to light. Conjunctivae and EOM are normal.  Neck: Normal range of motion. No tracheal deviation present. No thyromegaly present.  Cardiovascular: Normal rate and regular rhythm.  No murmur heard. Pulses:      Dorsalis pedis pulses are 2+ on the right side, and 2+ on the left side.  Respiratory: Effort normal and breath sounds normal. No respiratory distress.  GI: Soft. He exhibits no mass. There is no tenderness. Hernia confirmed negative in the right inguinal area and confirmed negative in the left inguinal area.  Genitourinary: Right testis shows no swelling and no tenderness.  Left testis shows no mass, no swelling and no tenderness. No penile erythema or penile tenderness. No discharge found.  Genitourinary Comments: Small epididymal cyst palpated in the right testicle,1-1.5,no tender,mobile,defined borders.  Musculoskeletal: He exhibits no edema or tenderness.  No major deformities appreciated and no signs of synovitis.  Lymphadenopathy:    He has no cervical adenopathy.       Right: No inguinal and no supraclavicular adenopathy present.       Left: No inguinal and no supraclavicular adenopathy present.  Neurological: He is alert and oriented to person, place, and time. He has normal strength. No cranial nerve deficit or sensory deficit. Coordination and gait normal.  Reflex Scores:      Bicep reflexes are 2+ on the right side and 2+ on the left side.      Patellar reflexes are 2+ on the right side and 2+ on the left side. Skin: Skin is warm. No rash noted. No erythema.  Psychiatric: He has a normal mood and affect. Cognition and memory are normal.  Well-groomed, good eye contact.     ASSESSMENT AND PLAN:  Mr. Solly was seen today for annual exam.  Orders Placed This Encounter  Procedures  . Basic metabolic panel   . Lipid panel  . Hemoglobin A1c  . PSA(Must document that pt has been informed of limitations of PSA testing.)     Lab Results  Component Value Date   CHOL 154 01/24/2018   HDL 43.70 01/24/2018   LDLCALC 83 01/24/2018   TRIG 136.0 01/24/2018   CHOLHDL 4 01/24/2018   Lab Results  Component Value Date   CREATININE 1.25 01/24/2018   BUN 17 01/24/2018   NA 139 01/24/2018   K 4.2 01/24/2018   CL 102 01/24/2018   CO2 28 01/24/2018   Lab Results  Component Value Date   HGBA1C 5.7 01/24/2018    Routine general medical examination at a health care facility  We discussed the importance of regular physical activity and healthy diet for prevention of chronic illness and/or complications. Preventive guidelines reviewed. Vaccination up to date. Next CPE in a year.  Diabetes mellitus screening -     Basic metabolic panel -     Hemoglobin A1c  Prostate cancer screening -     PSA(Must document that pt has been informed of limitations of PSA testing.)  Hypertension, essential, benign Adequately controlled. No changes in current management. BP goal < 135/85. DASH-Low-salt diet to continue. Eye exam is current, appointment in the next few days. At this time since he is checking BP periodically, doing well on current treatment, and as far as lab work is normal, I think we can continue following annually, before if needed.  He agrees with plan.   Hyperlipidemia Continue Atorvastatin 20 mg daily. Continue low-fat diet. Follow-up in 6 to 12 months.   Epididymal cyst- R Patient has been stable, asymptomatic. Recommend monitoring regularly and if any change in size or any symptom, urology referral will be considered.     Return in 1 year (on 01/25/2019) for CPE and f/u.    Zaida Reiland G. Martinique, MD  [facility]. [place] office.

## 2018-01-24 ENCOUNTER — Encounter: Payer: Self-pay | Admitting: Family Medicine

## 2018-01-24 ENCOUNTER — Ambulatory Visit (INDEPENDENT_AMBULATORY_CARE_PROVIDER_SITE_OTHER): Payer: BLUE CROSS/BLUE SHIELD | Admitting: Family Medicine

## 2018-01-24 VITALS — BP 134/82 | HR 73 | Temp 97.8°F | Resp 12 | Ht 73.0 in | Wt 207.4 lb

## 2018-01-24 DIAGNOSIS — Z0000 Encounter for general adult medical examination without abnormal findings: Secondary | ICD-10-CM

## 2018-01-24 DIAGNOSIS — N503 Cyst of epididymis: Secondary | ICD-10-CM

## 2018-01-24 DIAGNOSIS — I10 Essential (primary) hypertension: Secondary | ICD-10-CM

## 2018-01-24 DIAGNOSIS — E785 Hyperlipidemia, unspecified: Secondary | ICD-10-CM

## 2018-01-24 DIAGNOSIS — Z125 Encounter for screening for malignant neoplasm of prostate: Secondary | ICD-10-CM | POA: Diagnosis not present

## 2018-01-24 DIAGNOSIS — Z131 Encounter for screening for diabetes mellitus: Secondary | ICD-10-CM | POA: Diagnosis not present

## 2018-01-24 LAB — BASIC METABOLIC PANEL
BUN: 17 mg/dL (ref 6–23)
CO2: 28 meq/L (ref 19–32)
CREATININE: 1.25 mg/dL (ref 0.40–1.50)
Calcium: 9.3 mg/dL (ref 8.4–10.5)
Chloride: 102 mEq/L (ref 96–112)
GFR: 62.93 mL/min (ref >60.00)
Glucose, Bld: 105 mg/dL — ABNORMAL HIGH (ref 70–99)
Potassium: 4.2 mEq/L (ref 3.5–5.1)
Sodium: 139 mEq/L (ref 135–145)

## 2018-01-24 LAB — PSA: PSA: 0.61 ng/mL (ref 0.10–4.00)

## 2018-01-24 LAB — LIPID PANEL
CHOL/HDL RATIO: 4
Cholesterol: 154 mg/dL (ref 0–200)
HDL: 43.7 mg/dL (ref >39.00)
LDL Cholesterol: 83 mg/dL (ref 0–99)
NONHDL: 110.36
Triglycerides: 136 mg/dL (ref 0.0–149.0)
VLDL: 27.2 mg/dL (ref 0.0–40.0)

## 2018-01-24 LAB — HEMOGLOBIN A1C: HEMOGLOBIN A1C: 5.7 % (ref 4.6–6.5)

## 2018-01-24 MED ORDER — LOSARTAN POTASSIUM 50 MG PO TABS: 50.0000 mg | ORAL_TABLET | Freq: Every day | ORAL | 3 refills | Status: DC

## 2018-01-24 MED ORDER — ATORVASTATIN CALCIUM 20 MG PO TABS: 20.0000 mg | ORAL_TABLET | Freq: Every day | ORAL | 3 refills | Status: DC

## 2018-01-24 NOTE — Assessment & Plan Note (Signed)
Patient has been stable, asymptomatic. Recommend monitoring regularly and if any change in size or any symptom, urology referral will be considered.

## 2018-01-24 NOTE — Assessment & Plan Note (Signed)
Adequately controlled. No changes in current management. BP goal < 135/85. DASH-Low-salt diet to continue. Eye exam is current, appointment in the next few days. At this time since he is checking BP periodically, doing well on current treatment, and as far as lab work is normal, I think we can continue following annually, before if needed.  He agrees with plan.

## 2018-01-24 NOTE — Assessment & Plan Note (Signed)
Continue Atorvastatin 20 mg daily. Continue low-fat diet. Follow-up in 6 to 12 months.

## 2018-01-24 NOTE — Patient Instructions (Addendum)
A few things to remember from today's visit:   Routine general medical examination at a health care facility  Hyperlipidemia, unspecified hyperlipidemia type - Plan: Lipid panel  Diabetes mellitus screening - Plan: Basic metabolic panel, Hemoglobin A1c  Prostate cancer screening - Plan: PSA(Must document that pt has been informed of limitations of PSA testing.)   At least 150 minutes of moderate exercise per week, daily brisk walking for 15-30 min is a good exercise option. Healthy diet low in saturated (animal) fats and sweets and consisting of fresh fruits and vegetables, lean meats such as fish and white chicken and whole grains.  - Vaccines:  Tdap vaccine every 10 years.  Shingles vaccine recommended at age 36, could be given after 59 years of age but not sure about insurance coverage.  Pneumonia vaccines:  Prevnar 43 at 55 and Pneumovax at 65.   -Screening recommendations for low/normal risk males:  Screening for diabetes at age 70 and every 3 years. Earlier screening if cardiovascular risk factors.    Colon cancer screening at age 68 and until age 83.  Prostate cancer screening: some controversy, starts usually at 75: Rectal exam and PSA.  Aortic Abdominal Aneurism once between 41 and 31 years old if ever smoker.  Also recommended:  1. Dental visit- Brush and floss your teeth twice daily; visit your dentist twice a year. 2. Eye doctor- Get an eye exam at least every 2 years. 3. Helmet use- Always wear a helmet when riding a bicycle, motorcycle, rollerblading or skateboarding. 4. Safe sex- If you may be exposed to sexually transmitted infections, use a condom. 5. Seat belts- Seat belts can save your live; always wear one. 6. Smoke/Carbon Monoxide detectors- These detectors need to be installed on the appropriate level of your home. Replace batteries at least once a year. 7. Skin cancer- When out in the sun please cover up and use sunscreen 15 SPF or  higher. 8. Violence- If anyone is threatening or hurting you, please tell your healthcare provider.  9. Drink alcohol in moderation- Limit alcohol intake to one drink or less per day. Never drink and drive.  Please be sure medication list is accurate. If a new problem present, please set up appointment sooner than planned today.

## 2018-01-24 NOTE — Addendum Note (Signed)
Addended by: Martinique, BETTY G on: 01/24/2018 05:53 PM   Modules accepted: Orders

## 2018-04-03 ENCOUNTER — Other Ambulatory Visit: Payer: Self-pay | Admitting: Family Medicine

## 2018-04-03 DIAGNOSIS — E785 Hyperlipidemia, unspecified: Secondary | ICD-10-CM

## 2018-05-16 ENCOUNTER — Other Ambulatory Visit: Payer: Self-pay | Admitting: *Deleted

## 2018-05-16 DIAGNOSIS — I10 Essential (primary) hypertension: Secondary | ICD-10-CM

## 2018-05-16 MED ORDER — LOSARTAN POTASSIUM 50 MG PO TABS: 50.0000 mg | ORAL_TABLET | Freq: Every day | ORAL | 3 refills | Status: DC

## 2018-07-17 DIAGNOSIS — M13861 Other specified arthritis, right knee: Secondary | ICD-10-CM | POA: Diagnosis not present

## 2018-07-24 DIAGNOSIS — M170 Bilateral primary osteoarthritis of knee: Secondary | ICD-10-CM | POA: Diagnosis not present

## 2018-07-24 DIAGNOSIS — I10 Essential (primary) hypertension: Secondary | ICD-10-CM | POA: Diagnosis not present

## 2018-09-14 ENCOUNTER — Other Ambulatory Visit: Payer: Self-pay | Admitting: Family Medicine

## 2018-09-14 DIAGNOSIS — I10 Essential (primary) hypertension: Secondary | ICD-10-CM

## 2019-01-26 ENCOUNTER — Encounter: Payer: BLUE CROSS/BLUE SHIELD | Admitting: Family Medicine

## 2019-04-04 ENCOUNTER — Other Ambulatory Visit: Payer: Self-pay | Admitting: Family Medicine

## 2019-04-04 DIAGNOSIS — E785 Hyperlipidemia, unspecified: Secondary | ICD-10-CM

## 2019-04-09 ENCOUNTER — Other Ambulatory Visit: Payer: Self-pay | Admitting: Family Medicine

## 2019-04-09 DIAGNOSIS — E785 Hyperlipidemia, unspecified: Secondary | ICD-10-CM

## 2019-04-13 MED ORDER — ATORVASTATIN CALCIUM 20 MG PO TABS: 20.0000 mg | ORAL_TABLET | Freq: Every day | ORAL | 0 refills | Status: DC

## 2019-04-14 ENCOUNTER — Encounter: Payer: BLUE CROSS/BLUE SHIELD | Admitting: Family Medicine

## 2019-05-04 ENCOUNTER — Other Ambulatory Visit: Payer: Self-pay

## 2019-05-04 ENCOUNTER — Ambulatory Visit (INDEPENDENT_AMBULATORY_CARE_PROVIDER_SITE_OTHER): Payer: BC Managed Care – PPO | Admitting: Family Medicine

## 2019-05-04 ENCOUNTER — Encounter: Payer: Self-pay | Admitting: Family Medicine

## 2019-05-04 VITALS — BP 140/88 | HR 68 | Temp 97.5°F | Resp 12 | Ht 73.0 in | Wt 210.0 lb

## 2019-05-04 DIAGNOSIS — M109 Gout, unspecified: Secondary | ICD-10-CM | POA: Diagnosis not present

## 2019-05-04 DIAGNOSIS — R7301 Impaired fasting glucose: Secondary | ICD-10-CM

## 2019-05-04 DIAGNOSIS — Z0000 Encounter for general adult medical examination without abnormal findings: Secondary | ICD-10-CM

## 2019-05-04 DIAGNOSIS — E785 Hyperlipidemia, unspecified: Secondary | ICD-10-CM

## 2019-05-04 DIAGNOSIS — Z130 Encounter for screening for diseases of the blood and blood-forming organs and certain disorders involving the immune mechanism: Secondary | ICD-10-CM | POA: Diagnosis not present

## 2019-05-04 DIAGNOSIS — R221 Localized swelling, mass and lump, neck: Secondary | ICD-10-CM | POA: Diagnosis not present

## 2019-05-04 DIAGNOSIS — Z1329 Encounter for screening for other suspected endocrine disorder: Secondary | ICD-10-CM

## 2019-05-04 DIAGNOSIS — Z125 Encounter for screening for malignant neoplasm of prostate: Secondary | ICD-10-CM

## 2019-05-04 DIAGNOSIS — Z13228 Encounter for screening for other metabolic disorders: Secondary | ICD-10-CM | POA: Diagnosis not present

## 2019-05-04 DIAGNOSIS — I10 Essential (primary) hypertension: Secondary | ICD-10-CM | POA: Diagnosis not present

## 2019-05-04 LAB — COMPREHENSIVE METABOLIC PANEL
ALT: 30 U/L (ref 0–53)
AST: 20 U/L (ref 0–37)
Albumin: 4.4 g/dL (ref 3.5–5.2)
Alkaline Phosphatase: 79 U/L (ref 39–117)
BUN: 18 mg/dL (ref 6–23)
CO2: 27 mEq/L (ref 19–32)
Calcium: 9.6 mg/dL (ref 8.4–10.5)
Chloride: 104 mEq/L (ref 96–112)
Creatinine, Ser: 1.21 mg/dL (ref 0.40–1.50)
GFR: 61.21 mL/min (ref >60.00)
Glucose, Bld: 114 mg/dL — ABNORMAL HIGH (ref 70–99)
Potassium: 4.3 mEq/L (ref 3.5–5.1)
Sodium: 139 mEq/L (ref 135–145)
Total Bilirubin: 0.9 mg/dL (ref 0.2–1.2)
Total Protein: 6.9 g/dL (ref 6.0–8.3)

## 2019-05-04 LAB — HEMOGLOBIN A1C: Hgb A1c MFr Bld: 5.7 % (ref 4.6–6.5)

## 2019-05-04 LAB — URIC ACID: Uric Acid, Serum: 8.4 mg/dL — ABNORMAL HIGH (ref 4.0–7.8)

## 2019-05-04 LAB — LIPID PANEL
Cholesterol: 160 mg/dL (ref 0–200)
HDL: 41.5 mg/dL (ref >39.00)
LDL Cholesterol: 89 mg/dL (ref 0–99)
NonHDL: 118.07
Total CHOL/HDL Ratio: 4
Triglycerides: 147 mg/dL (ref 0.0–149.0)
VLDL: 29.4 mg/dL (ref 0.0–40.0)

## 2019-05-04 MED ORDER — LOSARTAN POTASSIUM 50 MG PO TABS: ORAL_TABLET | ORAL | 3 refills | Status: DC

## 2019-05-04 MED ORDER — COLCHICINE 0.6 MG PO TABS: 0.6000 mg | ORAL_TABLET | Freq: Every day | ORAL | 2 refills | Status: DC | PRN

## 2019-05-04 MED ORDER — PREDNISONE 20 MG PO TABS: 40.0000 mg | ORAL_TABLET | Freq: Every day | ORAL | 0 refills | Status: AC

## 2019-05-04 MED ORDER — ATORVASTATIN CALCIUM 20 MG PO TABS: 20.0000 mg | ORAL_TABLET | Freq: Every day | ORAL | 3 refills | Status: DC

## 2019-05-04 NOTE — Progress Notes (Signed)
   HPI:  Joseph Wheeler is a 57 y.o.male here today for his routine physical examination.  Last CPE: 01/24/2018. He lives with his wife.  Regular exercise 3 or more times per week: He is walking daily for 30 minutes on doing tai chi habits as per week. Following a healthy diet: Yes.  Chronic medical problems: Hypertension, hyperlipidemia, gout, knee OA,and IFG among some  Hx of STD's: Negative.  Immunization History  Administered Date(s) Administered  . Influenza,inj,Quad PF,6+ Mos 07/03/2016   -Hep C screening: 01/2017. Last colon cancer screening: 10/2013, 10 years f/u was recommended. Last prostate ca screening:  Lab Results  Component Value Date   PSA 0.61 01/24/2018   He denies urine frequency, gross hematuria, or urgency. Nocturia x0.  -Denies high alcohol intake, tobacco use, or Hx of illicit drug use.  -Concerns and/or follow up today:   Hyperlipidemia, currently he is on atorvastatin 20 mg daily. He is a still following low-fat diet. He has tolerated medication well.  Lab Results  Component Value Date   CHOL 154 01/24/2018   HDL 43.70 01/24/2018   LDLCALC 83 01/24/2018   TRIG 136.0 01/24/2018   CHOLHDL 4 01/24/2018    Lab Results  Component Value Date   HGBA1C 5.7 01/24/2018   Hypertension, currently he is on losartan 50 mg daily. He is not checking BP at home. Negative for headache, visual changes, chest pain, dyspnea, palpitation, or edema.  Lab Results  Component Value Date   CREATININE 1.25 01/24/2018   BUN 17 01/24/2018   NA 139 01/24/2018   K 4.2 01/24/2018   CL 102 01/24/2018   CO2 28 01/24/2018   3 to 4 days ago he woke up with severe pain in left first MTP joint.  + Edema and erythema. He took colchicine, expired (2017). He denies trauma. He just came back from vacation, he may have eaten more meet than usual. Also drinking little more alcohol since COVID-19 pandemia started. This is the first episode in 1 year.  Lab  Results  Component Value Date   LABURIC 7.7 01/22/2017    Review of Systems  Constitutional: Negative for activity change, appetite change, fatigue, fever and unexpected weight change.  HENT: Negative for dental problem, nosebleeds, sore throat and trouble swallowing.   Eyes: Negative for redness and visual disturbance.  Respiratory: Negative for cough and wheezing.   Gastrointestinal: Negative for abdominal pain, blood in stool, nausea and vomiting.  Endocrine: Negative for cold intolerance, heat intolerance, polydipsia and polyphagia.  Genitourinary: Negative for decreased urine volume, dysuria, genital sores, hematuria and testicular pain.  Musculoskeletal: Positive for arthralgias. Negative for gait problem and myalgias.  Skin: Negative for rash.  Allergic/Immunologic: Negative for environmental allergies.  Neurological: Negative for syncope, facial asymmetry and numbness.  Hematological: Negative for adenopathy. Does not bruise/bleed easily.  Psychiatric/Behavioral: Negative for confusion and sleep disturbance. The patient is not nervous/anxious.   All other systems reviewed and are negative.   No current outpatient medications on file prior to visit.   No current facility-administered medications on file prior to visit.    Past Medical History:  Diagnosis Date  . Arthritis   . Gout   . Hyperlipidemia   . Hypertension     History reviewed. No pertinent surgical history.  Allergies  Allergen Reactions  . Penicillins     Family History  Problem Relation Age of Onset  . Arthritis Mother   . Diabetes Mother   . Cancer Father          prostate  . Hypertension Father   . Hyperlipidemia Father   . Cancer Paternal Grandmother        breast.    Social History   Socioeconomic History  . Marital status: Married    Spouse name: Not on file  . Number of children: Not on file  . Years of education: Not on file  . Highest education level: Not on file  Occupational  History  . Not on file  Social Needs  . Financial resource strain: Not on file  . Food insecurity    Worry: Not on file    Inability: Not on file  . Transportation needs    Medical: Not on file    Non-medical: Not on file  Tobacco Use  . Smoking status: Never Smoker  . Smokeless tobacco: Never Used  Substance and Sexual Activity  . Alcohol use: No    Alcohol/week: 0.0 standard drinks  . Drug use: No  . Sexual activity: Not on file  Lifestyle  . Physical activity    Days per week: Not on file    Minutes per session: Not on file  . Stress: Not on file  Relationships  . Social connections    Talks on phone: Not on file    Gets together: Not on file    Attends religious service: Not on file    Active member of club or organization: Not on file    Attends meetings of clubs or organizations: Not on file    Relationship status: Not on file  Other Topics Concern  . Not on file  Social History Narrative  . Not on file    Vitals:   05/04/19 0756  BP: 140/88  Pulse: 68  Resp: 12  Temp: (!) 97.5 F (36.4 C)  SpO2: 99%   Body mass index is 27.71 kg/m.   Wt Readings from Last 3 Encounters:  05/04/19 210 lb (95.3 kg)  01/24/18 207 lb 6 oz (94.1 kg)  07/26/17 205 lb 8 oz (93.2 kg)    Physical Exam  Nursing note and vitals reviewed. Constitutional: He is oriented to person, place, and time. He appears well-developed. No distress.  HENT:  Head: Atraumatic.  Right Ear: Tympanic membrane, external ear and ear canal normal.  Left Ear: Tympanic membrane, external ear and ear canal normal.  Mouth/Throat: Oropharynx is clear and moist and mucous membranes are normal.  Eyes: Pupils are equal, round, and reactive to light. Conjunctivae and EOM are normal.  Neck: Normal range of motion. No tracheal deviation present. No thyromegaly present.    Fulness above right sternoclavicular joint,right beside thyroid gland. It does not move when swallowing.It is soft and no tender.   Cardiovascular: Normal rate and regular rhythm.  No murmur heard. Pulses:      Dorsalis pedis pulses are 2+ on the right side and 2+ on the left side.  Respiratory: Effort normal and breath sounds normal. No respiratory distress.  GI: Soft. He exhibits no mass. There is no hepatomegaly. There is no abdominal tenderness.  Genitourinary:    Genitourinary Comments: Refused,no concerns.   Musculoskeletal:        General: No tenderness or edema.     Comments: No major deformities appreciated and no signs of synovitis.  Lymphadenopathy:    He has no cervical adenopathy.       Right: No supraclavicular adenopathy present.       Left: No supraclavicular adenopathy present.  Neurological: He is alert and oriented to person,   place, and time. He has normal strength. No cranial nerve deficit or sensory deficit. Coordination and gait normal.  Reflex Scores:      Bicep reflexes are 2+ on the right side and 2+ on the left side.      Patellar reflexes are 2+ on the right side and 2+ on the left side. Skin: Skin is warm. No erythema.  Psychiatric: He has a normal mood and affect. Cognition and memory are normal.  Well groomed,good eye contact.    ASSESSMENT AND PLAN:  Mr. Ernest was seen today for annual exam and follow-up.  Diagnoses and all orders for this visit:  Orders Placed This Encounter  Procedures  . US THYROID  . Comprehensive metabolic panel  . Hemoglobin A1c  . TSH  . Lipid panel  . Uric acid  . PSA   Lab Results  Component Value Date   TSH 1.54 05/04/2019   Lab Results  Component Value Date   CREATININE 1.21 05/04/2019   BUN 18 05/04/2019   NA 139 05/04/2019   K 4.3 05/04/2019   CL 104 05/04/2019   CO2 27 05/04/2019   Lab Results  Component Value Date   HGBA1C 5.7 05/04/2019   Lab Results  Component Value Date   ALT 30 05/04/2019   AST 20 05/04/2019   ALKPHOS 79 05/04/2019   BILITOT 0.9 05/04/2019   Lab Results  Component Value Date   CHOL 160 05/04/2019    HDL 41.50 05/04/2019   LDLCALC 89 05/04/2019   TRIG 147.0 05/04/2019   CHOLHDL 4 05/04/2019   Lab Results  Component Value Date   PSA 0.49 05/04/2019   Lab Results  Component Value Date   LABURIC 8.4 (H) 05/04/2019    Routine general medical examination at a health care facility We discussed the importance of regular physical activity and healthy diet for prevention of chronic illness and/or complications. Preventive guidelines reviewed. Vaccination up-to-date.  Next CPE in a year.  Neck fullness ? Thyroid nodule. Further recommendation will be given according to lab/imaging results. Instructed to continue monitoring for changes.  -     TSH -     US THYROID; Future  Screening for endocrine, metabolic and immunity disorder -     Comprehensive metabolic panel -     Hemoglobin A1c  Prostate cancer screening -     PSA  Acute gout involving toe of left foot, unspecified cause Side effects of prednisone discussed. Low purine diet recommended. No changes in colchicine.  -     predniSONE (DELTASONE) 20 MG tablet; Take 2 tablets (40 mg total) by mouth daily with breakfast for 5 days.  Gout, arthropathy Currently he is having acute exacerbation.  Because he has not had frequent flareups, I do not think prophylactic treatment is needed at this time. Continue colchicine 0.65 mg daily as needed. Instructed not to take atorvastatin when he is taking colchicine.  Hyperlipidemia Continue atorvastatin 20 mg daily and low-fat diet. Treatment will be adjusted according to lipid panel results, if needed.  Hypertension, essential, benign SBP mildly elevated today. Continue losartan 50 mg daily. Recommend monitoring BP regularly. We discussed possible complications of elevated BP. Eye exam is current.  IFG (impaired fasting glucose) Healthy lifestyle for primary prevention. Further recommendation will be given according to A1c results.  Return in 1 year (on 05/03/2020)  for cpe and f/u.    Betty G. Jordan, MD  [facility]. [place] office. 

## 2019-05-04 NOTE — Assessment & Plan Note (Addendum)
Currently he is having acute exacerbation.  Because he has not had frequent flareups, I do not think prophylactic treatment is needed at this time. Continue colchicine 0.65 mg daily as needed. Instructed not to take atorvastatin when he is taking colchicine.

## 2019-05-04 NOTE — Patient Instructions (Addendum)
A few things to remember from today's visit:   Routine general medical examination at a health care facility  IFG (impaired fasting glucose)  Hypertension, essential, benign - Plan: TSH  Hyperlipidemia, unspecified hyperlipidemia type - Plan: Lipid panel  Gout, arthropathy - Plan: Uric acid  Neck fullness - Plan: TSH  Screening for endocrine, metabolic and immunity disorder - Plan: Comprehensive metabolic panel, Hemoglobin A1c  Prostate cancer screening - Plan: PSA  Acute gout involving toe of left foot, unspecified cause   Please be sure medication list is accurate. If a new problem present, please set up appointment sooner than planned today.  At least 150 minutes of moderate exercise per week, daily brisk walking for 15-30 min is a good exercise option. Healthy diet low in saturated (animal) fats and sweets and consisting of fresh fruits and vegetables, lean meats such as fish and white chicken and whole grains.  - Vaccines:  Tdap vaccine every 10 years.  Shingles vaccine recommended at age 23, could be given after 60 years of age but not sure about insurance coverage.  Pneumonia vaccines:  Prevnar 64 at 80 and Pneumovax at 103.   -Screening recommendations for low/normal risk males:  Screening for diabetes at age 68 and every 3 years. Earlier screening if cardiovascular risk factors.  Colon cancer screening at age 75 and until age 8.  Prostate cancer screening: some controversy, starts usually at 24: Rectal exam and PSA.  Aortic Abdominal Aneurism once between 90 and 45 years old if ever smoker.  Also recommended:  1. Dental visit- Brush and floss your teeth twice daily; visit your dentist twice a year. 2. Eye doctor- Get an eye exam at least every 2 years. 3. Helmet use- Always wear a helmet when riding a bicycle, motorcycle, rollerblading or skateboarding. 4. Safe sex- If you may be exposed to sexually transmitted infections, use a condom. 5. Seat belts-  Seat belts can save your live; always wear one. 6. Smoke/Carbon Monoxide detectors- These detectors need to be installed on the appropriate level of your home. Replace batteries at least once a year. 7. Skin cancer- When out in the sun please cover up and use sunscreen 15 SPF or higher. 8. Violence- If anyone is threatening or hurting you, please tell your healthcare provider.  9. Drink alcohol in moderation- Limit alcohol intake to one drink or less per day. Never drink and drive.

## 2019-05-04 NOTE — Assessment & Plan Note (Signed)
Continue atorvastatin 20 mg daily and low-fat diet. Treatment will be adjusted according to lipid panel results, if needed.

## 2019-05-04 NOTE — Assessment & Plan Note (Signed)
Healthy lifestyle for primary prevention. Further recommendation will be given according to A1c results.  

## 2019-05-04 NOTE — Assessment & Plan Note (Signed)
SBP mildly elevated today. Continue losartan 50 mg daily. Recommend monitoring BP regularly. We discussed possible complications of elevated BP. Eye exam is current.

## 2019-05-05 ENCOUNTER — Encounter: Payer: Self-pay | Admitting: Family Medicine

## 2019-05-05 LAB — PSA: PSA: 0.49 ng/mL (ref 0.10–4.00)

## 2019-05-05 LAB — TSH: TSH: 1.54 u[IU]/mL (ref 0.35–4.50)

## 2019-05-08 ENCOUNTER — Telehealth: Payer: Self-pay

## 2019-05-08 NOTE — Telephone Encounter (Signed)
Pt will need order changed/new order.

## 2019-05-08 NOTE — Telephone Encounter (Signed)
Message sent to referral coordinator

## 2019-05-08 NOTE — Telephone Encounter (Signed)
Copied from [place] [id number]. Topic: General - Other >> May 08, 2019  8:39 AM Rainey Pines A wrote: Patient would like the office where he gets his ultrasound done switched to a different facility. Patient would like ultrasound done closer to [facility]. Patient would like a callback

## 2019-05-11 ENCOUNTER — Other Ambulatory Visit: Payer: Self-pay | Admitting: Family Medicine

## 2019-05-11 DIAGNOSIS — R221 Localized swelling, mass and lump, neck: Secondary | ICD-10-CM

## 2019-05-11 NOTE — Telephone Encounter (Signed)
Order replaced,please change if needed. Thanks, BJ

## 2019-05-13 ENCOUNTER — Encounter: Payer: Self-pay | Admitting: Family Medicine

## 2019-05-19 ENCOUNTER — Other Ambulatory Visit: Payer: Self-pay

## 2019-05-19 ENCOUNTER — Ambulatory Visit (HOSPITAL_BASED_OUTPATIENT_CLINIC_OR_DEPARTMENT_OTHER)
Admission: RE | Admit: 2019-05-19 | Discharge: 2019-05-19 | Disposition: A | Discharge status: Home / Self Care | Payer: BC Managed Care – PPO | Source: Ambulatory Visit | Attending: Family Medicine | Admitting: Family Medicine

## 2019-05-19 DIAGNOSIS — E041 Nontoxic single thyroid nodule: Secondary | ICD-10-CM | POA: Diagnosis not present

## 2019-05-19 DIAGNOSIS — R221 Localized swelling, mass and lump, neck: Secondary | ICD-10-CM | POA: Diagnosis not present

## 2019-05-22 ENCOUNTER — Other Ambulatory Visit: Payer: Self-pay | Admitting: Family Medicine

## 2019-05-22 DIAGNOSIS — M109 Gout, unspecified: Secondary | ICD-10-CM

## 2019-05-24 ENCOUNTER — Encounter: Payer: Self-pay | Admitting: Family Medicine

## 2019-07-01 ENCOUNTER — Encounter: Payer: Self-pay | Admitting: Family Medicine

## 2019-07-06 NOTE — Telephone Encounter (Signed)
Please advise 

## 2019-09-24 IMAGING — US US THYROID
1 series · 13 of 25 positions shown · non-contrast
Comparison: None.

CLINICAL DATA: Thyroid nodule, neck fullness

EXAM:
THYROID ULTRASOUND
TECHNIQUE: Ultrasound examination of the thyroid gland and adjacent soft
tissues was performed.

[Series 1: us thyroid · 13 of 79 slices shown]
[im 1/79]
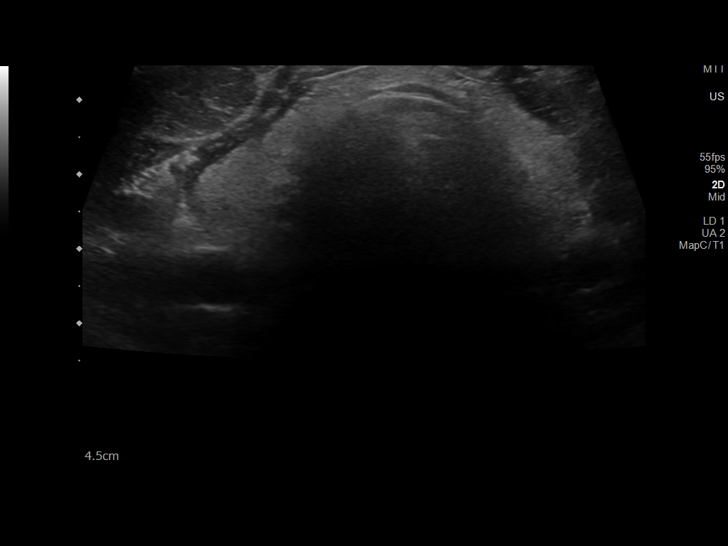
[im 7/79]
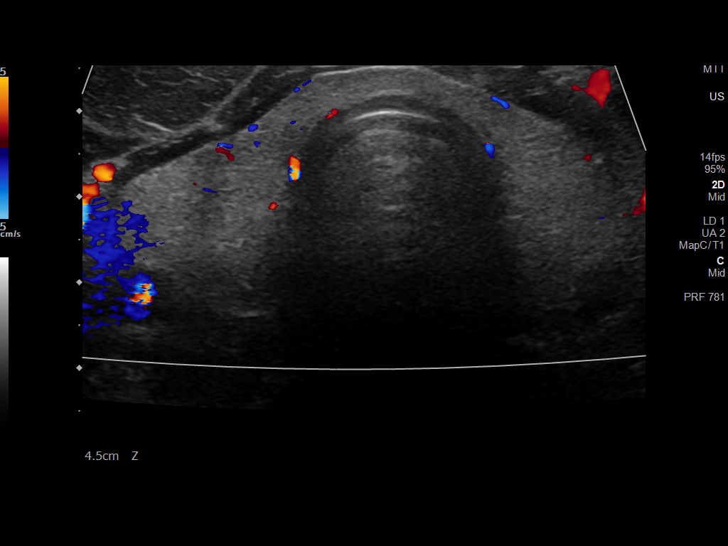
[im 14/79]
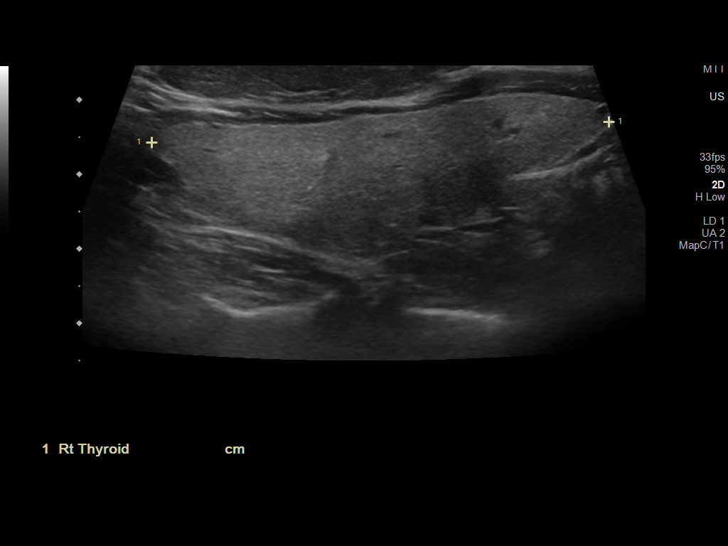
[im 20/79]
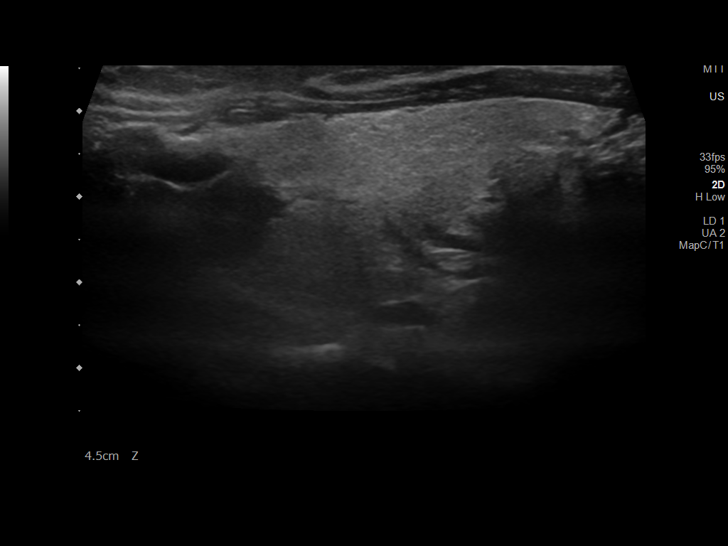
[im 27/79]
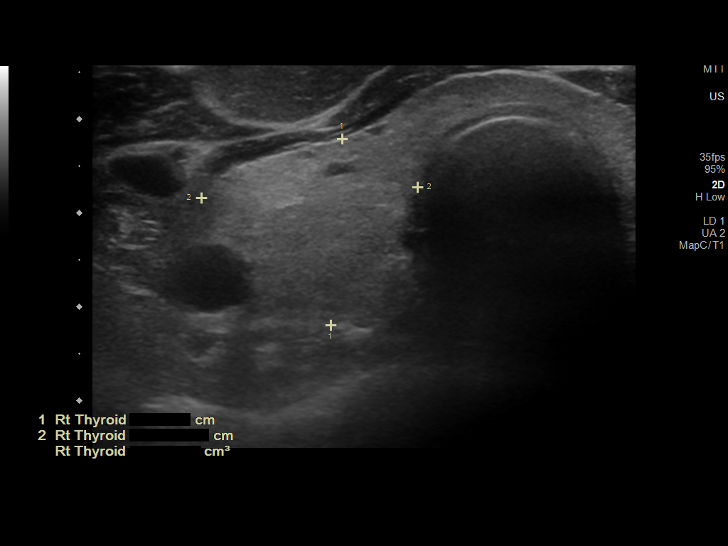
[im 33/79]
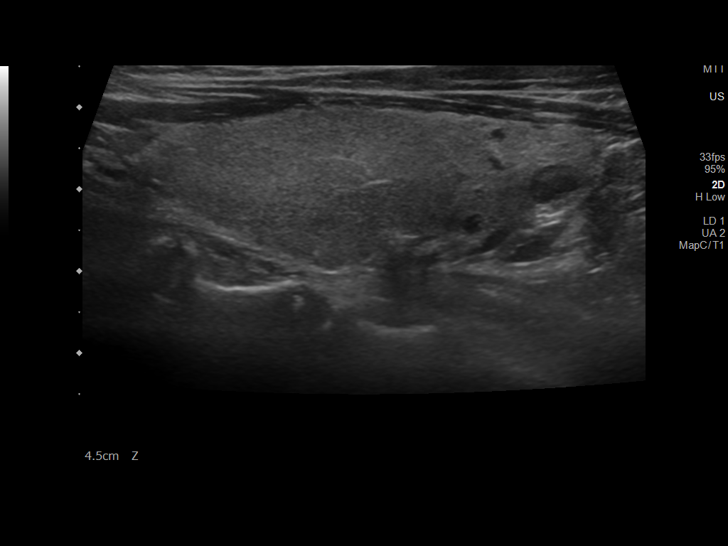
[im 40/79]
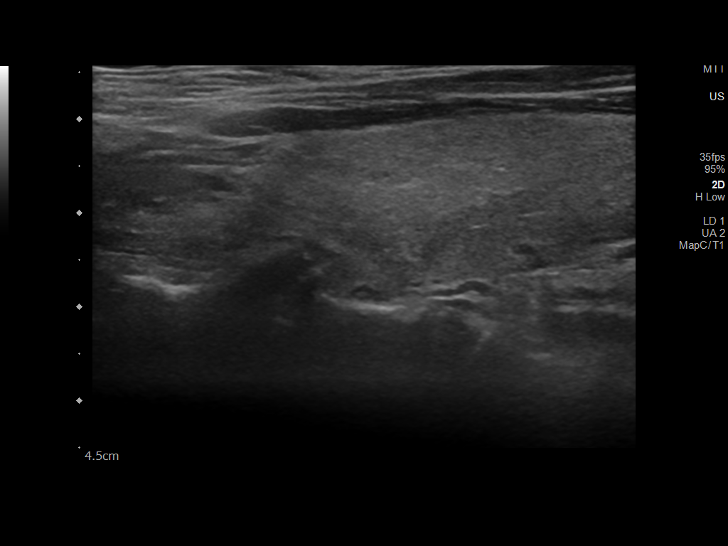
[im 46/79]
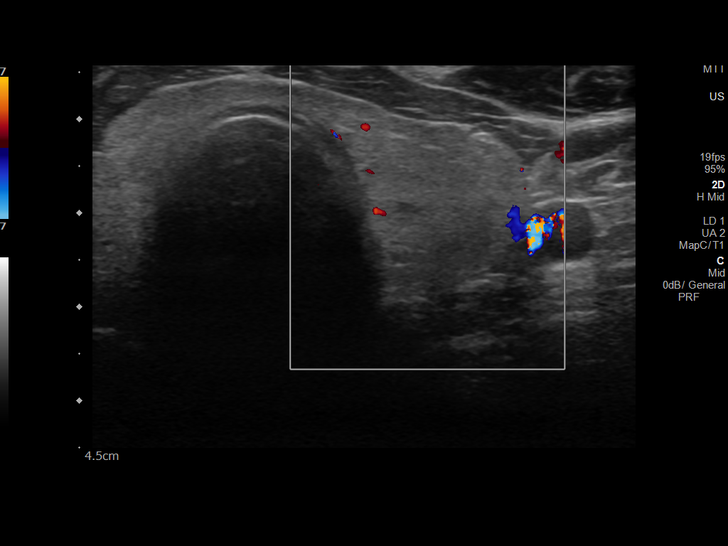
[im 53/79]
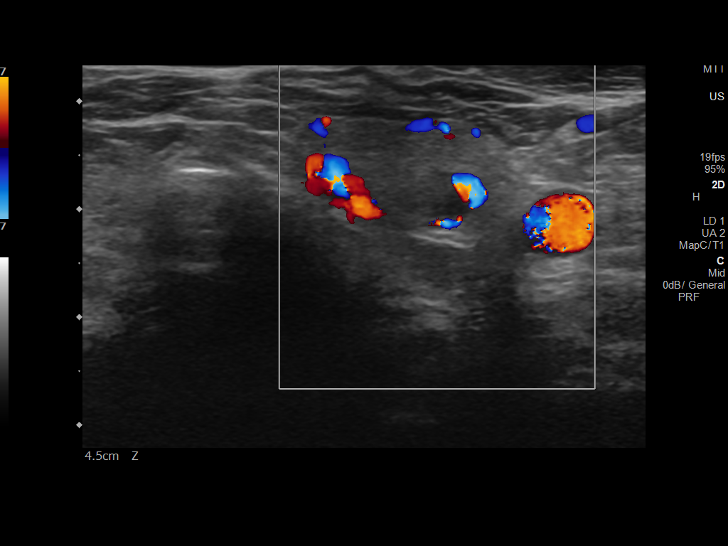
[im 59/79]
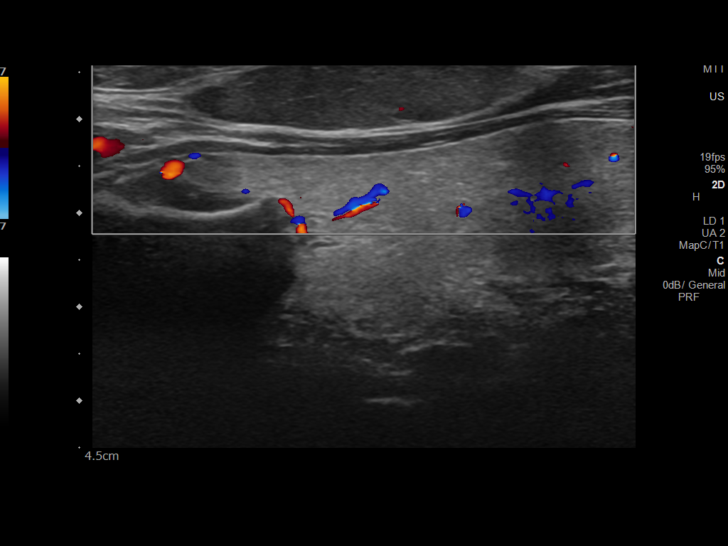
[im 66/79]
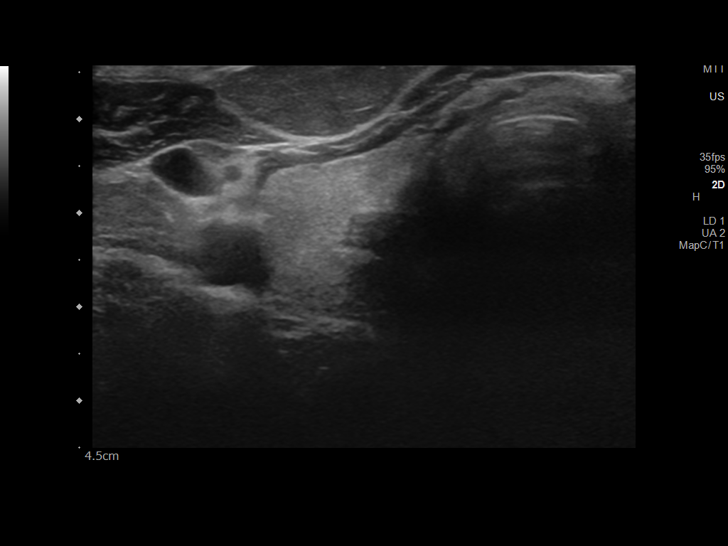
[im 72/79]
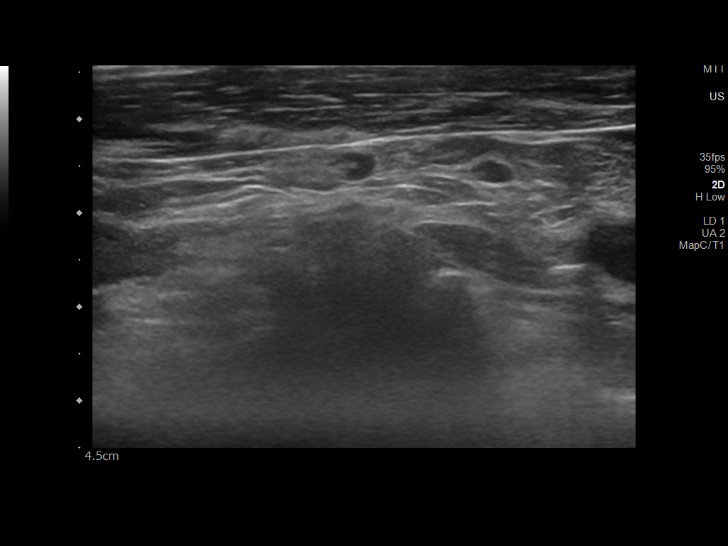
[im 79/79]
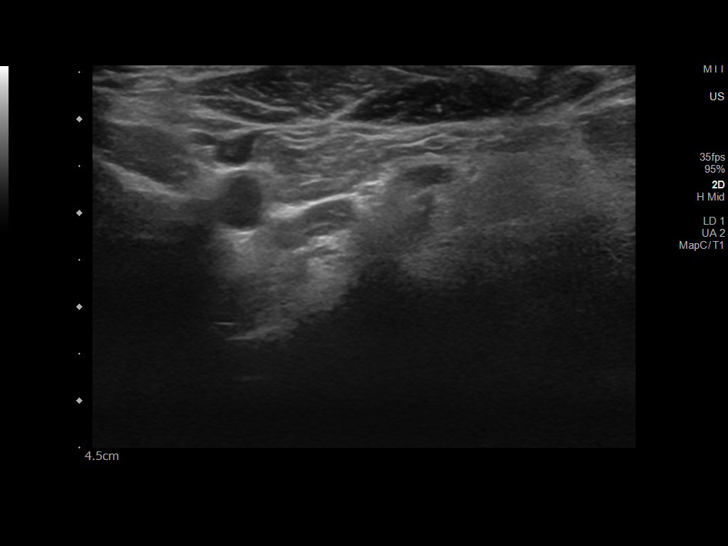

[13 of 25 positions shown; findings below may reference images not displayed]

FINDINGS: Parenchymal Echotexture: Mildly heterogenous

Isthmus: 4 mm

Right lobe: 6.1 x 2.0 x 2.3 cm

Left lobe: 5.6 x 1.6 x 1.9 cm

_________________________________________________________

Estimated total number of nodules >/= 1 cm: 0

Number of spongiform nodules >/=  2 cm not described below (TR1): 0

Number of mixed cystic and solid nodules >/= 1.5 cm not described
below (TR2): 0

_________________________________________________________

Very minor thyroid heterogeneity. Vascularity. Subcentimeter
hypoechoic nodule in the left thyroid inferiorly measures 7 mm. This
would not meet criteria for any biopsy or follow-up.

Anterior to the right thyroid lobe, there is an elongated slightly
oval hypoechoic subcutaneous soft tissue abnormality. This finding
is homogeneous with well-defined margins and minimal internal
vascularity. The abnormality measures 4.4 x 0.8 x 2.2 cm. This is
favored to represent a superficial soft tissue lipoma.

No regional adenopathy, fluid collection, hematoma, or abscess.
IMPRESSION: Minor thyroid heterogeneity.

Subcentimeter left inferior thyroid nodule would not meet criteria
for biopsy or follow-up.

Right anterior neck superficial soft tissue abnormality overlies the
right thyroid lobe, favored to represent a lipoma.

The above is in keeping with the ACR TI-RADS recommendations - [HOSPITAL] 9333;[DATE].

## 2020-05-06 ENCOUNTER — Other Ambulatory Visit: Payer: Self-pay | Admitting: Family Medicine

## 2020-05-06 DIAGNOSIS — I10 Essential (primary) hypertension: Secondary | ICD-10-CM

## 2020-05-07 ENCOUNTER — Other Ambulatory Visit: Payer: Self-pay | Admitting: Family Medicine

## 2020-05-07 DIAGNOSIS — E785 Hyperlipidemia, unspecified: Secondary | ICD-10-CM

## 2020-05-23 ENCOUNTER — Other Ambulatory Visit: Payer: Self-pay

## 2020-05-23 ENCOUNTER — Ambulatory Visit (INDEPENDENT_AMBULATORY_CARE_PROVIDER_SITE_OTHER): Payer: BC Managed Care – PPO | Admitting: Family Medicine

## 2020-05-23 ENCOUNTER — Encounter: Payer: Self-pay | Admitting: Family Medicine

## 2020-05-23 VITALS — BP 126/70 | HR 87 | Temp 98.2°F | Resp 12 | Ht 73.0 in | Wt 211.0 lb

## 2020-05-23 DIAGNOSIS — Z125 Encounter for screening for malignant neoplasm of prostate: Secondary | ICD-10-CM | POA: Diagnosis not present

## 2020-05-23 DIAGNOSIS — Z0000 Encounter for general adult medical examination without abnormal findings: Secondary | ICD-10-CM

## 2020-05-23 DIAGNOSIS — Z130 Encounter for screening for diseases of the blood and blood-forming organs and certain disorders involving the immune mechanism: Secondary | ICD-10-CM

## 2020-05-23 DIAGNOSIS — R7301 Impaired fasting glucose: Secondary | ICD-10-CM | POA: Diagnosis not present

## 2020-05-23 DIAGNOSIS — Z13228 Encounter for screening for other metabolic disorders: Secondary | ICD-10-CM | POA: Diagnosis not present

## 2020-05-23 DIAGNOSIS — L298 Other pruritus: Secondary | ICD-10-CM

## 2020-05-23 DIAGNOSIS — E785 Hyperlipidemia, unspecified: Secondary | ICD-10-CM | POA: Diagnosis not present

## 2020-05-23 DIAGNOSIS — M109 Gout, unspecified: Secondary | ICD-10-CM | POA: Diagnosis not present

## 2020-05-23 DIAGNOSIS — I10 Essential (primary) hypertension: Secondary | ICD-10-CM

## 2020-05-23 DIAGNOSIS — Z1329 Encounter for screening for other suspected endocrine disorder: Secondary | ICD-10-CM

## 2020-05-23 DIAGNOSIS — Z23 Encounter for immunization: Secondary | ICD-10-CM

## 2020-05-23 MED ORDER — LOSARTAN POTASSIUM 50 MG PO TABS: ORAL_TABLET | ORAL | 3 refills | Status: DC

## 2020-05-23 NOTE — Assessment & Plan Note (Signed)
Continue a healthy lifestyle for primary prevention of diabetes. Further recommendation will be given according to A1c and glucose results.

## 2020-05-23 NOTE — Assessment & Plan Note (Signed)
For now continue colchicine 0.6 mg daily as needed. Further recommendation will be given according to uric acid results. Continue low purine diet.

## 2020-05-23 NOTE — Assessment & Plan Note (Signed)
Continue atorvastatin 20 mg daily and low-fat diet. Further recommendation will be given according to lipid panel results. 

## 2020-05-23 NOTE — Assessment & Plan Note (Signed)
BP adequately controlled. No changes in current management. Continue monitoring BP at home. 

## 2020-05-23 NOTE — Progress Notes (Signed)
HPI: [name] is a 59 y.o.male here today for his routine physical examination.  Last CPE: 05/03/20. He lives with his wife.  Regular exercise 3 or more times per week: Still doing Tai Chi and walking a few times per week. He has a sedentary job. Following a healthy diet: Yes  Chronic medical problems: HTN,gout,HLD,OA (knees mainly) ,and IFG.  Immunization History  Administered Date(s) Administered  . Influenza,inj,Quad PF,6+ Mos 07/03/2016  . Influenza-Unspecified 07/17/2017, 07/01/2018, 06/09/2019  . PFIZER SARS-COV-2 Vaccination 11/24/2019, 12/22/2019  . Zoster Recombinat (Shingrix) 05/23/2020   Hep C screening: 01/2017 NR. Last colon cancer screening: Colonoscopy in 10/2013, 10 years f/u was recommended. Last prostate ca screening: PSA normal at 0.49. Nocturia x 0.  Negative for high alcohol intake or tobacco use. Sleeping about 7 hours. He drinks about once per week, whisky.   -Concerns and/or follow up today:   Erythematous area on right side intermittently for a year. Pruritic some times. He has used OTC Cortisol. Negative for new detergent,soap,lotion,outdoors exposure,and insect bite. FHx negative for rheumatologies disorder.  Hyperlipidemia: Currently he is on atorvastatin 20 mg daily. Lab Results  Component Value Date   CHOL 160 05/04/2019   HDL 41.50 05/04/2019   LDLCALC 89 05/04/2019   TRIG 147.0 05/04/2019   CHOLHDL 4 05/04/2019   Gout: He is on colchicine 0.6 mg daily as needed. Last gout exacerbation x 1, a month after last visit.  Lab Results  Component Value Date   LABURIC 8.4 (H) 05/04/2019   Hypertension: Currently he is on losartan 50 mg daily. BP readings at home:125-130/80 Negative for unusual/severe headache,CP,SOB,or edema.  Lab Results  Component Value Date   CREATININE 1.21 05/04/2019   BUN 18 05/04/2019   NA 139 05/04/2019   K 4.3 05/04/2019   CL 104 05/04/2019   CO2 27 05/04/2019   Review of Systems    Constitutional: Negative for activity change, appetite change, fatigue, fever and unexpected weight change.  HENT: Negative for dental problem, nosebleeds, sore throat and trouble swallowing.   Eyes: Negative for redness and visual disturbance.  Respiratory: Negative for cough and wheezing.   Cardiovascular: Negative for palpitations and leg swelling.  Gastrointestinal: Negative for abdominal pain, blood in stool, nausea and vomiting.  Endocrine: Negative for cold intolerance, heat intolerance, polydipsia, polyphagia and polyuria.  Genitourinary: Negative for decreased urine volume, dysuria, genital sores, hematuria and testicular pain.  Musculoskeletal: Positive for arthralgias. Negative for gait problem and myalgias.  Skin: Negative for color change and rash.  Neurological: Negative for dizziness, syncope and weakness.  Hematological: Negative for adenopathy. Does not bruise/bleed easily.  Psychiatric/Behavioral: Negative for confusion and sleep disturbance.  All other systems reviewed and are negative.  Current Outpatient Medications on File Prior to Visit  Medication Sig Dispense Refill  . atorvastatin (LIPITOR) 20 MG tablet TAKE 1 TABLET (20 MG TOTAL) BY MOUTH DAILY WITH SUPPER. 90 tablet 3  . colchicine 0.6 MG tablet TAKE 1 TABLET (0.6 MG TOTAL) BY MOUTH DAILY AS NEEDED. 10 tablet 8   No current facility-administered medications on file prior to visit.     Past Medical History:  Diagnosis Date  . Arthritis   . Gout   . Hyperlipidemia   . Hypertension     History reviewed. No pertinent surgical history.  Allergies  Allergen Reactions  . Penicillins     Family History  Problem Relation Age of Onset  . Arthritis Mother   . Diabetes Mother   . Cancer  Father        prostate  . Hypertension Father   . Hyperlipidemia Father   . Cancer Paternal Grandmother        breast.    Social History   Socioeconomic History  . Marital status: Married    Spouse name: Not on  file  . Number of children: Not on file  . Years of education: Not on file  . Highest education level: Not on file  Occupational History  . Not on file  Tobacco Use  . Smoking status: Never Smoker  . Smokeless tobacco: Never Used  Substance and Sexual Activity  . Alcohol use: No    Alcohol/week: 0.0 standard drinks  . Drug use: No  . Sexual activity: Not on file  Other Topics Concern  . Not on file  Social History Narrative  . Not on file   Social Determinants of Health   Financial Resource Strain:   . Difficulty of Paying Living Expenses: Not on file  Food Insecurity:   . Worried About Charity fundraiser in the Last Year: Not on file  . Ran Out of Food in the Last Year: Not on file  Transportation Needs:   . Lack of Transportation (Medical): Not on file  . Lack of Transportation (Non-Medical): Not on file  Physical Activity:   . Days of Exercise per Week: Not on file  . Minutes of Exercise per Session: Not on file  Stress:   . Feeling of Stress : Not on file  Social Connections:   . Frequency of Communication with Friends and Family: Not on file  . Frequency of Social Gatherings with Friends and Family: Not on file  . Attends Religious Services: Not on file  . Active Member of Clubs or Organizations: Not on file  . Attends Archivist Meetings: Not on file  . Marital Status: Not on file    Vitals:   05/23/20 0800  BP: 126/70  Pulse: 87  Resp: 12  Temp: 98.2 F (36.8 C)  SpO2: 98%   Body mass index is 27.84 kg/m.  Wt Readings from Last 3 Encounters:  05/23/20 211 lb (95.7 kg)  05/04/19 210 lb (95.3 kg)  01/24/18 207 lb 6 oz (94.1 kg)   Physical Exam Vitals and nursing note reviewed.  Constitutional:      General: He is not in acute distress.    Appearance: He is well-developed and well-groomed.  HENT:     Head: Normocephalic and atraumatic.     Right Ear: Tympanic membrane, ear canal and external ear normal.     Left Ear: Tympanic  membrane, ear canal and external ear normal.     Mouth/Throat:     Mouth: Mucous membranes are moist.     Pharynx: Oropharynx is clear.  Eyes:     Extraocular Movements: Extraocular movements intact.     Conjunctiva/sclera: Conjunctivae normal.     Pupils: Pupils are equal, round, and reactive to light.  Neck:     Thyroid: No thyromegaly.     Trachea: No tracheal deviation.  Cardiovascular:     Rate and Rhythm: Normal rate and regular rhythm.     Pulses:          Dorsalis pedis pulses are 2+ on the right side and 2+ on the left side.     Heart sounds: No murmur heard.   Pulmonary:     Effort: Pulmonary effort is normal. No respiratory distress.     Breath  sounds: Normal breath sounds.  Abdominal:     Palpations: Abdomen is soft. There is no hepatomegaly or mass.     Tenderness: There is no abdominal tenderness.  Genitourinary:    Comments: No concerns. Musculoskeletal:        General: No tenderness.     Cervical back: Normal range of motion.     Comments: No signs of synovitis.  Lymphadenopathy:     Cervical: No cervical adenopathy.     Upper Body:     Right upper body: No supraclavicular adenopathy.     Left upper body: No supraclavicular adenopathy.  Skin:    General: Skin is warm.     Findings: No ecchymosis. Rash is not papular or pustular.       Neurological:     Mental Status: He is alert and oriented to person, place, and time.     Cranial Nerves: No cranial nerve deficit.     Sensory: No sensory deficit.     Coordination: Coordination normal.     Gait: Gait normal.     Deep Tendon Reflexes:     Reflex Scores:      Bicep reflexes are 2+ on the right side and 2+ on the left side.      Patellar reflexes are 2+ on the right side and 2+ on the left side. Psychiatric:        Mood and Affect: Mood and affect normal.        Cognition and Memory: Cognition normal.    ASSESSMENT AND PLAN:  [email or web address] was seen today for annual exam and follow-up.  Diagnoses and  all orders for this visit: Orders Placed This Encounter  Procedures  . Varicella-zoster vaccine IM  . COMPLETE METABOLIC PANEL WITH GFR  . Lipid panel  . Hemoglobin A1c  . Uric acid  . PSA(Must document that pt has been informed of limitations of PSA testing.)  . Ambulatory referral to Dermatology   Lab Results  Component Value Date   PSA 0.42 05/23/2020   PSA 0.49 05/04/2019   PSA 0.61 01/24/2018    Lab Results  Component Value Date   HGBA1C 5.7 (H) 05/23/2020   Lab Results  Component Value Date   CHOL 164 05/23/2020   HDL 47 05/23/2020   LDLCALC 93 05/23/2020   TRIG 141 05/23/2020   CHOLHDL 3.5 05/23/2020   Lab Results  Component Value Date   LABURIC 8.5 (H) 05/23/2020   Routine general medical examination at a health care facility Encouraged to engage in regular,low impact,physical activity and continue following a healthful diet for prevention of chronic illness and/or complications. Preventive guidelines reviewed. Vaccination updated. He is getting influenza vaccine through work.  Next CPE in a year. The 10-year ASCVD risk score Mikey Bussing DC Brooke Bonito., et al., 2013) is: 8.5%   Values used to calculate the score:     Age: 57 years     Sex: Male     Is Non-Hispanic African American: No     Diabetic: No     Tobacco smoker: No     Systolic Blood Pressure: 032 mmHg     Is BP treated: Yes     HDL Cholesterol: 47 mg/dL     Total Cholesterol: 164 mg/dL  Prostate cancer screening -     PSA(Must document that pt has been informed of limitations of PSA testing.)  Screening for endocrine, metabolic and immunity disorder -     Hemoglobin A1c -     COMPLETE METABOLIC  PANEL WITH GFR  Pruritic erythematous rash Localized thick skin, topical steroid not helping. ? Morphea.  We discussed options: Stronger topical steroid or dermatologist, he prefers the latter one.  Need for shingles vaccine -     Varicella-zoster vaccine IM  Hyperlipidemia Continue atorvastatin 20 mg  daily and low-fat diet. Further recommendation will be given according to lipid panel results.  Hypertension, essential, benign BP adequately controlled. No changes in current management. Continue monitoring BP at home.  Gouty arthritis of toe of left foot For now continue colchicine 0.6 mg daily as needed. Further recommendation will be given according to uric acid results. Continue low purine diet.  IFG (impaired fasting glucose) Continue a healthy lifestyle for primary prevention of diabetes. Further recommendation will be given according to A1c and glucose results.   Return in 1 year (on 05/23/2021) for cpe.   Hisae Decoursey G. Martinique, MD  [facility]. [place] office.  A few things to remember from today's visit:   Hyperlipidemia, unspecified hyperlipidemia type  IFG (impaired fasting glucose)  Gout, arthropathy  Hypertension, essential, benign  If you need refills please call your pharmacy. Do not use My Chart to request refills or for acute issues that need immediate attention.    Please be sure medication list is accurate. If a new problem present, please set up appointment sooner than planned today.    At least 150 minutes of moderate exercise per week, daily brisk walking for 15-30 min is a good exercise option. Healthy diet low in saturated (animal) fats and sweets and consisting of fresh fruits and vegetables, lean meats such as fish and white chicken and whole grains.  - Vaccines:  Tdap vaccine every 10 years.  Shingles vaccine recommended at age 38, could be given after 61 years of age but not sure about insurance coverage.  Pneumonia vaccines: Pneumovax at 57   -Screening recommendations for low/normal risk males:  Screening for diabetes at age 40 and every 3 years. Earlier screening if cardiovascular risk factors.   Lipid screening at 35 and every 3 years. Screening starts in younger males with cardiovascular risk factors. N/A  Colon  cancer screening is now at age 85 but your insurance may not cover until age 46 .screening is recommended age 61.  Prostate cancer screening: some controversy, starts usually at 92: Rectal exam and PSA.  Aortic Abdominal Aneurism once between 56 and 69 years old if ever smoker.  Also recommended:  1. Dental visit- Brush and floss your teeth twice daily; visit your dentist twice a year. 2. Eye doctor- Get an eye exam at least every 2 years. 3. Helmet use- Always wear a helmet when riding a bicycle, motorcycle, rollerblading or skateboarding. 4. Safe sex- If you may be exposed to sexually transmitted infections, use a condom. 5. Seat belts- Seat belts can save your live; always wear one. 6. Smoke/Carbon Monoxide detectors- These detectors need to be installed on the appropriate level of your home. Replace batteries at least once a year. 7. Skin cancer- When out in the sun please cover up and use sunscreen 15 SPF or higher. 8. Violence- If anyone is threatening or hurting you, please tell your healthcare provider.  9. Drink alcohol in moderation- Limit alcohol intake to one drink or less per day. Never drink and drive.

## 2020-05-23 NOTE — Patient Instructions (Signed)
A few things to remember from today's visit:   Hyperlipidemia, unspecified hyperlipidemia type  IFG (impaired fasting glucose)  Gout, arthropathy  Hypertension, essential, benign  If you need refills please call your pharmacy. Do not use My Chart to request refills or for acute issues that need immediate attention.    Please be sure medication list is accurate. If a new problem present, please set up appointment sooner than planned today.    At least 150 minutes of moderate exercise per week, daily brisk walking for 15-30 min is a good exercise option. Healthy diet low in saturated (animal) fats and sweets and consisting of fresh fruits and vegetables, lean meats such as fish and white chicken and whole grains.  - Vaccines:  Tdap vaccine every 10 years.  Shingles vaccine recommended at age 34, could be given after 61 years of age but not sure about insurance coverage.  Pneumonia vaccines: Pneumovax at 86   -Screening recommendations for low/normal risk males:  Screening for diabetes at age 2 and every 3 years. Earlier screening if cardiovascular risk factors.   Lipid screening at 35 and every 3 years. Screening starts in younger males with cardiovascular risk factors. N/A  Colon cancer screening is now at age 83 but your insurance may not cover until age 65 .screening is recommended age 44.  Prostate cancer screening: some controversy, starts usually at 11: Rectal exam and PSA.  Aortic Abdominal Aneurism once between 70 and 66 years old if ever smoker.  Also recommended:  1. Dental visit- Brush and floss your teeth twice daily; visit your dentist twice a year. 2. Eye doctor- Get an eye exam at least every 2 years. 3. Helmet use- Always wear a helmet when riding a bicycle, motorcycle, rollerblading or skateboarding. 4. Safe sex- If you may be exposed to sexually transmitted infections, use a condom. 5. Seat belts- Seat belts can save your live; always wear  one. 6. Smoke/Carbon Monoxide detectors- These detectors need to be installed on the appropriate level of your home. Replace batteries at least once a year. 7. Skin cancer- When out in the sun please cover up and use sunscreen 15 SPF or higher. 8. Violence- If anyone is threatening or hurting you, please tell your healthcare provider.  9. Drink alcohol in moderation- Limit alcohol intake to one drink or less per day. Never drink and drive.

## 2020-05-24 LAB — COMPLETE METABOLIC PANEL WITH GFR
AG Ratio: 1.6 (calc) (ref 1.0–2.5)
ALT: 34 U/L (ref 9–46)
AST: 24 U/L (ref 10–35)
Albumin: 4.4 g/dL (ref 3.6–5.1)
Alkaline phosphatase (APISO): 86 U/L (ref 35–144)
BUN: 16 mg/dL (ref 7–25)
CO2: 29 mmol/L (ref 20–32)
Calcium: 10 mg/dL (ref 8.6–10.3)
Chloride: 105 mmol/L (ref 98–110)
Creat: 1.14 mg/dL (ref 0.70–1.25)
GFR, Est African American: 81 mL/min/{1.73_m2} (ref >=60)
GFR, Est Non African American: 70 mL/min/{1.73_m2} (ref >=60)
Globulin: 2.7 g/dL (calc) (ref 1.9–3.7)
Glucose, Bld: 120 mg/dL — ABNORMAL HIGH (ref 65–99)
Potassium: 4.8 mmol/L (ref 3.5–5.3)
Sodium: 141 mmol/L (ref 135–146)
Total Bilirubin: 1.2 mg/dL (ref 0.2–1.2)
Total Protein: 7.1 g/dL (ref 6.1–8.1)

## 2020-05-24 LAB — LIPID PANEL
Cholesterol: 164 mg/dL (ref <200)
HDL: 47 mg/dL (ref >=40)
LDL Cholesterol (Calc): 93 mg/dL (calc)
Non-HDL Cholesterol (Calc): 117 mg/dL (calc) (ref <130)
Total CHOL/HDL Ratio: 3.5 (calc) (ref <5.0)
Triglycerides: 141 mg/dL (ref <150)

## 2020-05-24 LAB — URIC ACID: Uric Acid, Serum: 8.5 mg/dL — ABNORMAL HIGH (ref 4.0–8.0)

## 2020-05-24 LAB — HEMOGLOBIN A1C
Hgb A1c MFr Bld: 5.7 % of total Hgb — ABNORMAL HIGH (ref <5.7)
Mean Plasma Glucose: 117 (calc)
eAG (mmol/L): 6.5 (calc)

## 2020-05-24 LAB — PSA: PSA: 0.42 ng/mL (ref <=4.0)

## 2020-06-03 ENCOUNTER — Encounter: Payer: Self-pay | Admitting: Family Medicine

## 2020-06-03 ENCOUNTER — Telehealth: Payer: Self-pay | Admitting: Dermatology

## 2020-06-03 NOTE — Telephone Encounter (Signed)
Patient is returning Shea's call.  Patient says he no longer needs an appointment and will call for future appointment when needed.

## 2020-07-19 ENCOUNTER — Encounter: Payer: Self-pay | Admitting: Family Medicine

## 2020-08-02 DIAGNOSIS — L309 Dermatitis, unspecified: Secondary | ICD-10-CM | POA: Diagnosis not present

## 2021-01-10 ENCOUNTER — Telehealth: Payer: BC Managed Care – PPO | Admitting: Physician Assistant

## 2021-01-10 DIAGNOSIS — M545 Low back pain, unspecified: Secondary | ICD-10-CM

## 2021-01-10 MED ORDER — CYCLOBENZAPRINE HCL 10 MG PO TABS: 10.0000 mg | ORAL_TABLET | Freq: Three times a day (TID) | ORAL | 0 refills | Status: DC | PRN

## 2021-01-10 MED ORDER — NAPROXEN 500 MG PO TABS: 500.0000 mg | ORAL_TABLET | Freq: Two times a day (BID) | ORAL | 0 refills | Status: DC

## 2021-01-10 NOTE — Progress Notes (Signed)

## 2021-01-10 NOTE — Progress Notes (Signed)
I have spent 5 minutes in review of e-visit questionnaire, review and updating patient chart, medical decision making and response to patient.   Velinda Wrobel Cody Waniya Hoglund, PA-C    

## 2021-02-07 ENCOUNTER — Other Ambulatory Visit: Payer: Self-pay | Admitting: Family Medicine

## 2021-02-07 DIAGNOSIS — E785 Hyperlipidemia, unspecified: Secondary | ICD-10-CM

## 2021-02-09 ENCOUNTER — Encounter: Payer: Self-pay | Admitting: Family Medicine

## 2021-05-05 ENCOUNTER — Other Ambulatory Visit: Payer: Self-pay | Admitting: Family Medicine

## 2021-05-05 DIAGNOSIS — I10 Essential (primary) hypertension: Secondary | ICD-10-CM

## 2021-05-08 DIAGNOSIS — H40013 Open angle with borderline findings, low risk, bilateral: Secondary | ICD-10-CM | POA: Diagnosis not present

## 2021-05-23 ENCOUNTER — Other Ambulatory Visit: Payer: Self-pay

## 2021-05-24 ENCOUNTER — Ambulatory Visit (INDEPENDENT_AMBULATORY_CARE_PROVIDER_SITE_OTHER): Payer: BC Managed Care – PPO | Admitting: Family Medicine

## 2021-05-24 ENCOUNTER — Encounter: Payer: Self-pay | Admitting: Family Medicine

## 2021-05-24 VITALS — BP 128/70 | HR 78 | Resp 16 | Ht 73.0 in | Wt 213.2 lb

## 2021-05-24 DIAGNOSIS — E785 Hyperlipidemia, unspecified: Secondary | ICD-10-CM

## 2021-05-24 DIAGNOSIS — Z130 Encounter for screening for diseases of the blood and blood-forming organs and certain disorders involving the immune mechanism: Secondary | ICD-10-CM | POA: Diagnosis not present

## 2021-05-24 DIAGNOSIS — M109 Gout, unspecified: Secondary | ICD-10-CM

## 2021-05-24 DIAGNOSIS — Z0000 Encounter for general adult medical examination without abnormal findings: Secondary | ICD-10-CM | POA: Diagnosis not present

## 2021-05-24 DIAGNOSIS — Z1329 Encounter for screening for other suspected endocrine disorder: Secondary | ICD-10-CM | POA: Diagnosis not present

## 2021-05-24 DIAGNOSIS — Z125 Encounter for screening for malignant neoplasm of prostate: Secondary | ICD-10-CM | POA: Diagnosis not present

## 2021-05-24 DIAGNOSIS — Z13228 Encounter for screening for other metabolic disorders: Secondary | ICD-10-CM | POA: Diagnosis not present

## 2021-05-24 DIAGNOSIS — I10 Essential (primary) hypertension: Secondary | ICD-10-CM | POA: Diagnosis not present

## 2021-05-24 DIAGNOSIS — D170 Benign lipomatous neoplasm of skin and subcutaneous tissue of head, face and neck: Secondary | ICD-10-CM

## 2021-05-24 LAB — LIPID PANEL
Cholesterol: 150 mg/dL (ref 0–200)
HDL: 42.7 mg/dL (ref >39.00)
LDL Cholesterol: 86 mg/dL (ref 0–99)
NonHDL: 107.11
Total CHOL/HDL Ratio: 4
Triglycerides: 106 mg/dL (ref 0.0–149.0)
VLDL: 21.2 mg/dL (ref 0.0–40.0)

## 2021-05-24 LAB — COMPREHENSIVE METABOLIC PANEL
ALT: 31 U/L (ref 0–53)
AST: 24 U/L (ref 0–37)
Albumin: 4.2 g/dL (ref 3.5–5.2)
Alkaline Phosphatase: 78 U/L (ref 39–117)
BUN: 15 mg/dL (ref 6–23)
CO2: 25 mEq/L (ref 19–32)
Calcium: 10 mg/dL (ref 8.4–10.5)
Chloride: 104 mEq/L (ref 96–112)
Creatinine, Ser: 1.13 mg/dL (ref 0.40–1.50)
GFR: 69.98 mL/min (ref >60.00)
Glucose, Bld: 97 mg/dL (ref 70–99)
Potassium: 4.3 mEq/L (ref 3.5–5.1)
Sodium: 139 mEq/L (ref 135–145)
Total Bilirubin: 1.2 mg/dL (ref 0.2–1.2)
Total Protein: 7 g/dL (ref 6.0–8.3)

## 2021-05-24 LAB — URIC ACID: Uric Acid, Serum: 7.9 mg/dL — ABNORMAL HIGH (ref 4.0–7.8)

## 2021-05-24 LAB — HEMOGLOBIN A1C: Hgb A1c MFr Bld: 5.7 % (ref 4.6–6.5)

## 2021-05-24 LAB — PSA: PSA: 0.48 ng/mL (ref 0.10–4.00)

## 2021-05-24 MED ORDER — LOSARTAN POTASSIUM 50 MG PO TABS: ORAL_TABLET | ORAL | 2 refills | Status: DC

## 2021-05-24 MED ORDER — COLCHICINE 0.6 MG PO TABS: 0.6000 mg | ORAL_TABLET | Freq: Every day | ORAL | 8 refills | Status: DC | PRN

## 2021-05-24 MED ORDER — ATORVASTATIN CALCIUM 20 MG PO TABS: ORAL_TABLET | ORAL | 3 refills | Status: DC

## 2021-05-24 NOTE — Assessment & Plan Note (Signed)
Lesion has been stable. Continue monitoring for changes.

## 2021-05-24 NOTE — Assessment & Plan Note (Signed)
Continue Atorvastatin 20 mg daily and low fat diet. Further recommendations according to FLP results. 

## 2021-05-24 NOTE — Progress Notes (Signed)
HPI: [name] is a 60 y.o.male here today for his routine physical examination and follow up.  Last CPE: 05/23/20 He lives with his wife.   Regular exercise 3 or more times per week: Still doing Tai Chi and walking daily for 30 min. He has a sedentary job, now he is working from home. Following a healthy diet: Yes, his wife loves to cook, so sometimes lunches are big.   Chronic medical problems: HTN,gout,HLD,OA (knees mainly) ,and IFG.  Immunization History  Administered Date(s) Administered   Influenza,inj,Quad PF,6+ Mos 07/03/2016   Influenza-Unspecified 07/17/2017, 07/01/2018, 06/09/2019   PFIZER(Purple Top)SARS-COV-2 Vaccination 11/24/2019, 12/22/2019   Zoster Recombinat (Shingrix) 05/23/2020  Shingrix x 2.  Hep C screening: 01/2017 NR. Last colon cancer screening: Colonoscopy in 10/2013, 10 years f/u was recommended. Last prostate ca screening: PSA normal at 0.49. Nocturia x 0.  Negative for high alcohol intake or tobacco use.  -Concerns and/or follow up today:   Hypertension:  Medications:Losartan 50 mg daily. BP readings at home:120's/70's. Side effects:None.  Negative for unusual or severe headache, visual changes, exertional chest pain, dyspnea,  focal weakness, or edema.  Lab Results  Component Value Date   CREATININE 1.14 05/23/2020   BUN 16 05/23/2020   NA 141 05/23/2020   K 4.8 05/23/2020   CL 105 05/23/2020   CO2 29 05/23/2020   Hyperlipidemia: Currently on Atorvastatin 20 mg daily.  Lab Results  Component Value Date   CHOL 164 05/23/2020   HDL 47 05/23/2020   LDLCALC 93 05/23/2020   TRIG 141 05/23/2020   CHOLHDL 3.5 05/23/2020   Gout: He has not had exacerbations in the past year. He has some arthralgias LE's after hiking. He is on Colchicine 0.6 mg daily prn.  Lab Results  Component Value Date   LABURIC 8.5 (H) 05/23/2020   Since his last visit he had episode of back pain ,had an E-visit. Flexeril and Naproxen  prescribed. Pain has resolved.  Review of Systems  Constitutional:  Positive for unexpected weight change. Negative for activity change, appetite change, fatigue and fever.  HENT:  Negative for dental problem, nosebleeds, sore throat, trouble swallowing and voice change.   Eyes:  Negative for redness and visual disturbance.  Respiratory:  Negative for cough and wheezing.   Cardiovascular:  Negative for chest pain and palpitations.  Gastrointestinal:  Negative for abdominal pain, blood in stool, nausea and vomiting.  Endocrine: Negative for cold intolerance, heat intolerance, polydipsia, polyphagia and polyuria.  Genitourinary:  Negative for decreased urine volume, dysuria, genital sores, hematuria and testicular pain.  Musculoskeletal:  Positive for arthralgias. Negative for gait problem and myalgias.  Skin:  Negative for color change and rash.  Allergic/Immunologic: Negative for environmental allergies.  Neurological:  Negative for syncope, facial asymmetry and weakness.  Hematological:  Negative for adenopathy. Does not bruise/bleed easily.  Psychiatric/Behavioral:  Negative for confusion and sleep disturbance. The patient is not nervous/anxious.    Current Outpatient Medications on File Prior to Visit  Medication Sig Dispense Refill   atorvastatin (LIPITOR) 20 MG tablet TAKE 1 TABLET BY MOUTH WITH SUPPER 90 tablet 2   No current facility-administered medications on file prior to visit.   Past Medical History:  Diagnosis Date   Arthritis    Gout    Hyperlipidemia    Hypertension    History reviewed. No pertinent surgical history.  Allergies  Allergen Reactions   Penicillins    Family History  Problem Relation Age of Onset  Arthritis Mother    Diabetes Mother    Cancer Father        prostate   Hypertension Father    Hyperlipidemia Father    Cancer Paternal Grandmother        breast.   Social History   Socioeconomic History   Marital status: Married    Spouse  name: Not on file   Number of children: Not on file   Years of education: Not on file   Highest education level: Not on file  Occupational History   Not on file  Tobacco Use   Smoking status: Never   Smokeless tobacco: Never  Substance and Sexual Activity   Alcohol use: No    Alcohol/week: 0.0 standard drinks   Drug use: No   Sexual activity: Not on file  Other Topics Concern   Not on file  Social History Narrative   Not on file   Social Determinants of Health   Financial Resource Strain: Not on file  Food Insecurity: Not on file  Transportation Needs: Not on file  Physical Activity: Not on file  Stress: Not on file  Social Connections: Not on file   Vitals:   05/24/21 0748  BP: 128/70  Pulse: 78  Resp: 16  SpO2: 97%   Body mass index is 28.13 kg/m.  Wt Readings from Last 3 Encounters:  05/24/21 213 lb 4 oz (96.7 kg)  05/23/20 211 lb (95.7 kg)  05/04/19 210 lb (95.3 kg)   Physical Exam Vitals and nursing note reviewed.  Constitutional:      General: He is not in acute distress.    Appearance: He is well-developed and well-groomed.  HENT:     Head: Normocephalic and atraumatic.     Right Ear: Tympanic membrane, ear canal and external ear normal.     Left Ear: Tympanic membrane, ear canal and external ear normal.     Mouth/Throat:     Mouth: Mucous membranes are moist.     Pharynx: Oropharynx is clear.  Eyes:     Extraocular Movements: Extraocular movements intact.     Conjunctiva/sclera: Conjunctivae normal.     Pupils: Pupils are equal, round, and reactive to light.  Neck:     Thyroid: No thyroid mass.     Trachea: No tracheal deviation.   Cardiovascular:     Rate and Rhythm: Normal rate and regular rhythm.     Pulses:          Dorsalis pedis pulses are 2+ on the right side and 2+ on the left side.     Heart sounds: No murmur heard. Pulmonary:     Effort: Pulmonary effort is normal. No respiratory distress.     Breath sounds: Normal breath sounds.   Abdominal:     Palpations: Abdomen is soft. There is no hepatomegaly or mass.     Tenderness: There is no abdominal tenderness.  Genitourinary:    Comments: No concerns. Musculoskeletal:        General: No tenderness.     Cervical back: Normal range of motion.     Comments: No major deformities appreciated and no signs of synovitis.  Lymphadenopathy:     Cervical: No cervical adenopathy.     Upper Body:     Right upper body: No supraclavicular adenopathy.     Left upper body: No supraclavicular adenopathy.  Skin:    General: Skin is warm.     Findings: No erythema.  Neurological:     Mental Status:  He is alert and oriented to person, place, and time.     Cranial Nerves: No cranial nerve deficit.     Sensory: No sensory deficit.     Coordination: Coordination normal.     Gait: Gait normal.     Deep Tendon Reflexes:     Reflex Scores:      Bicep reflexes are 2+ on the right side and 2+ on the left side.      Patellar reflexes are 2+ on the right side and 2+ on the left side. Psychiatric:        Mood and Affect: Mood and affect normal.   ASSESSMENT AND PLAN:  [email or web address] was seen today for annual exam and follow-up.  Diagnoses and all orders for this visit: Orders Placed This Encounter  Procedures   Comprehensive metabolic panel   Hemoglobin A1c   Lipid panel   PSA(Must document that pt has been informed of limitations of PSA testing.)   Uric acid   Uric acid, random urine   Lab Results  Component Value Date   HGBA1C 5.7 05/24/2021   Lab Results  Component Value Date   LABURIC 7.9 (H) 05/24/2021   Lab Results  Component Value Date   CREATININE 1.13 05/24/2021   BUN 15 05/24/2021   NA 139 05/24/2021   K 4.3 05/24/2021   CL 104 05/24/2021   CO2 25 05/24/2021   Lab Results  Component Value Date   ALT 31 05/24/2021   AST 24 05/24/2021   ALKPHOS 78 05/24/2021   BILITOT 1.2 05/24/2021   Lab Results  Component Value Date   CHOL 150 05/24/2021   HDL 42.70  05/24/2021   LDLCALC 86 05/24/2021   TRIG 106.0 05/24/2021   CHOLHDL 4 05/24/2021   Lab Results  Component Value Date   PSA 0.48 05/24/2021   Routine general medical examination at a health care facility We discussed the importance of regular physical activity and healthy diet for prevention of chronic illness and/or complications. Preventive guidelines reviewed. Vaccination up to date. Next CPE in a year. The 10-year ASCVD risk score (Arnett DK, et al., 2019) is: 9.5%   Values used to calculate the score:     Age: 19 years     Sex: Male     Is Non-Hispanic African American: No     Diabetic: No     Tobacco smoker: No     Systolic Blood Pressure: 299 mmHg     Is BP treated: Yes     HDL Cholesterol: 42.7 mg/dL     Total Cholesterol: 150 mg/dL  Prostate cancer screening -     PSA(Must document that pt has been informed of limitations of PSA testing.)  Screening for endocrine, metabolic and immunity disorder -     Hemoglobin A1c -     Comprehensive metabolic panel  Hypertension, essential, benign BP adequately controlled. Continue current management: Losartan 50 mg daily. DASH/low salt diet to continue. Continue monitoring BP at home.  Hyperlipidemia Continue Atorvastatin 20 mg daily and low fat diet. Further recommendations according to FLP results.  Gout, arthropathy No exacerbations for at least a year, uric acid has been mildly elevated but stable. Continue low purine diet and Colchicine 0.6 mg daily prn for acute flare ups.  Lipoma of skin and subcutaneous tissue of neck Lesion has been stable. Continue monitoring for changes.  Return in 1 year (on 05/24/2022) for CPE and f/u.  Rosangela Fehrenbach G. [place], MD  [facility]. [facility].

## 2021-05-24 NOTE — Patient Instructions (Addendum)
A few things to remember from today's visit:  Routine general medical examination at a health care facility  Hypertension, essential, benign  Hyperlipidemia, unspecified hyperlipidemia type - Plan: Lipid panel  Gout, arthropathy  Prostate cancer screening - Plan: PSA(Must document that pt has been informed of limitations of PSA testing.)  Screening for endocrine, metabolic and immunity disorder - Plan: Comprehensive metabolic panel, Hemoglobin A1c  If you need refills please call your pharmacy. Do not use My Chart to request refills or for acute issues that need immediate attention.   Please be sure medication list is accurate. If a new problem present, please set up appointment sooner than planned today.  No changes in medications today. Continue monitoring blood pressure. Meals portion controlled.  Health Maintenance, Male Adopting a healthy lifestyle and getting preventive care are important in promoting health and wellness. Ask your health care provider about: The right schedule for you to have regular tests and exams. Things you can do on your own to prevent diseases and keep yourself healthy. What should I know about diet, weight, and exercise? Eat a healthy diet  Eat a diet that includes plenty of vegetables, fruits, low-fat dairy products, and lean protein. Do not eat a lot of foods that are high in solid fats, added sugars, or sodium. Maintain a healthy weight Body mass index (BMI) is a measurement that can be used to identify possible weight problems. It estimates body fat based on height and weight. Your health care provider can help determine your BMI and help you achieve or maintain a healthy weight. Get regular exercise Get regular exercise. This is one of the most important things you can do for your health. Most adults should: Exercise for at least 150 minutes each week. The exercise should increase your heart rate and make you sweat (moderate-intensity  exercise). Do strengthening exercises at least twice a week. This is in addition to the moderate-intensity exercise. Spend less time sitting. Even light physical activity can be beneficial. Watch cholesterol and blood lipids Have your blood tested for lipids and cholesterol at 62 years of age, then have this test every 5 years. You may need to have your cholesterol levels checked more often if: Your lipid or cholesterol levels are high. You are older than 62 years of age. You are at high risk for heart disease. What should I know about cancer screening? Many types of cancers can be detected early and may often be prevented. Depending on your health history and family history, you may need to have cancer screening at various ages. This may include screening for: Colorectal cancer. Prostate cancer. Skin cancer. Lung cancer. What should I know about heart disease, diabetes, and high blood pressure? Blood pressure and heart disease High blood pressure causes heart disease and increases the risk of stroke. This is more likely to develop in people who have high blood pressure readings, are of African descent, or are overweight. Talk with your health care provider about your target blood pressure readings. Have your blood pressure checked: Every 3-5 years if you are 29-40 years of age. Every year if you are 47 years old or older. If you are between the ages of 71 and 41 and are a current or former smoker, ask your health care provider if you should have a one-time screening for abdominal aortic aneurysm (AAA). Diabetes Have regular diabetes screenings. This checks your fasting blood sugar level. Have the screening done: Once every three years after age 41 if you are  at a normal weight and have a low risk for diabetes. More often and at a younger age if you are overweight or have a high risk for diabetes. What should I know about preventing infection? Hepatitis B If you have a higher risk for  hepatitis B, you should be screened for this virus. Talk with your health care provider to find out if you are at risk for hepatitis B infection. Hepatitis C Blood testing is recommended for: Everyone born from [birth year] through 1967. Anyone with known risk factors for hepatitis C. Sexually transmitted infections (STIs) You should be screened each year for STIs, including gonorrhea and chlamydia, if: You are sexually active and are younger than 62 years of age. You are older than 62 years of age and your health care provider tells you that you are at risk for this type of infection. Your sexual activity has changed since you were last screened, and you are at increased risk for chlamydia or gonorrhea. Ask your health care provider if you are at risk. Ask your health care provider about whether you are at high risk for HIV. Your health care provider may recommend a prescription medicine to help prevent HIV infection. If you choose to take medicine to prevent HIV, you should first get tested for HIV. You should then be tested every 3 months for as long as you are taking the medicine. Follow these instructions at home: Lifestyle Do not use any products that contain nicotine or tobacco, such as cigarettes, e-cigarettes, and chewing tobacco. If you need help quitting, ask your health care provider. Do not use street drugs. Do not share needles. Ask your health care provider for help if you need support or information about quitting drugs. Alcohol use Do not drink alcohol if your health care provider tells you not to drink. If you drink alcohol: Limit how much you have to 0-2 drinks a day. Be aware of how much alcohol is in your drink. In the [place] one drink equals one 12 oz bottle of beer (355 mL), one 5 oz glass of wine (148 mL), or one 1 oz glass of hard liquor (44 mL). General instructions Schedule regular health, dental, and eye exams. Stay current with your vaccines. Tell your health care  provider if: You often feel depressed. You have ever been abused or do not feel safe at home. Summary Adopting a healthy lifestyle and getting preventive care are important in promoting health and wellness. Follow your health care provider's instructions about healthy diet, exercising, and getting tested or screened for diseases. Follow your health care provider's instructions on monitoring your cholesterol and blood pressure. This information is not intended to replace advice given to you by your health care provider. Make sure you discuss any questions you have with your health care provider. Document Revised: 11/04/2020 Document Reviewed: 08/20/2018 Elsevier Patient Education  2022 [employer].

## 2021-05-24 NOTE — Assessment & Plan Note (Addendum)
No exacerbations for at least a year, uric acid has been mildly elevated but stable. Continue low purine diet and Colchicine 0.6 mg daily prn for acute flare ups.

## 2021-05-24 NOTE — Assessment & Plan Note (Addendum)
BP adequately controlled. Continue current management: Losartan 50 mg daily. DASH/low salt diet to continue. Continue monitoring BP at home.

## 2021-05-26 LAB — URIC ACID, RANDOM URINE: Uric Acid, Urine: 34 mg/dL

## 2021-08-01 ENCOUNTER — Other Ambulatory Visit: Payer: Self-pay | Admitting: Family Medicine

## 2021-08-01 DIAGNOSIS — I10 Essential (primary) hypertension: Secondary | ICD-10-CM

## 2021-08-06 ENCOUNTER — Other Ambulatory Visit: Payer: Self-pay | Admitting: Family Medicine

## 2021-08-06 DIAGNOSIS — I10 Essential (primary) hypertension: Secondary | ICD-10-CM

## 2022-04-24 ENCOUNTER — Other Ambulatory Visit: Payer: Self-pay | Admitting: Family Medicine

## 2022-04-24 DIAGNOSIS — I10 Essential (primary) hypertension: Secondary | ICD-10-CM

## 2022-05-25 NOTE — Progress Notes (Unsigned)
HPI: [name] is a 61 y.o.male here today for his routine physical examination.  Last CPE: 05/24/21 No new problems since his last visit.  He is still exercising regularly, not as intense as he has in prior years. Walking briskly 2-3 times per week for 30 min. He is following a healthful diet, eating vegetables daily, has increased red meet intake, eating fish and chicken. Beer 2-3 times per week.  Chronic medical problems: Gout,HLD,HTN,and IFG among some.  Immunization History  Administered Date(s) Administered   Influenza,inj,Quad PF,6+ Mos 07/03/2016   Influenza-Unspecified 07/17/2017, 07/01/2018, 06/09/2019, 06/22/2020, 06/21/2021   PFIZER(Purple Top)SARS-COV-2 Vaccination 11/24/2019, 12/22/2019, 08/02/2020, 12/30/2020   Pfizer Covid-19 Vaccine Bivalent Booster 5y-11y 06/27/2021   Zoster Recombinat (Shingrix) 05/23/2020, 08/02/2020   Health Maintenance  Topic Date Due   INFLUENZA VACCINE  04/10/2022   TETANUS/TDAP  05/12/2023   COLONOSCOPY (Pts 45-51yr Insurance coverage will need to be confirmed)  10/12/2023   COVID-19 Vaccine  Completed   Hepatitis C Screening  Completed   HIV Screening  Completed   Zoster Vaccines- Shingrix  Completed   HPV VACCINES  Aged Out   Last prostate ca screening:  Nocturia x 0. FHx positive for prostate cancer. Lab Results  Component Value Date   PSA 0.48 05/24/2021   PSA 0.42 05/23/2020   PSA 0.49 05/04/2019   Never smoker. -Concerns and/or follow up today:   Bilateral knee pain, getting worse, L>R. No hx of trauma. Problem has been going on for years.  HLD on Atorvastatin 20 mg daily. Tolerating medication well.  Lab Results  Component Value Date   CHOL 150 05/24/2021   HDL 42.70 05/24/2021   LDLCALC 86 05/24/2021   TRIG 106.0 05/24/2021   CHOLHDL 4 05/24/2021   Gout: He is on Colchicine 0.6 mg daily as needed. Flare ups every 3-4 months, mild, usually left great toe MTP joint.  Lab Results  Component  Value Date   LABURIC 7.9 (H) 05/24/2021   HTN on Losartan 50 mg daily. Home BP's 120-130's/80's.  Lab Results  Component Value Date   CREATININE 1.13 05/24/2021   BUN 15 05/24/2021   NA 139 05/24/2021   K 4.3 05/24/2021   CL 104 05/24/2021   CO2 25 05/24/2021   For the past year he has had mild non productive cough. Mainly am and pm. + Post nasal drainage. Has not identified exacerbating or alleviating factors. He has not tried OTC medications. No associated wheezing ,orthopnea,PND,or CP.  Occasional heartburn.  Review of Systems  Constitutional:  Negative for activity change, appetite change, fatigue, fever and unexpected weight change.  HENT:  Positive for postnasal drip. Negative for facial swelling, nosebleeds, sore throat and trouble swallowing.   Eyes:  Negative for redness and visual disturbance.  Respiratory:  Positive for cough. Negative for shortness of breath and wheezing.   Cardiovascular:  Negative for chest pain, palpitations and leg swelling.  Gastrointestinal:  Negative for abdominal pain, nausea and vomiting.  Endocrine: Negative for cold intolerance, heat intolerance, polydipsia, polyphagia and polyuria.  Genitourinary:  Negative for decreased urine volume, dysuria and hematuria.  Musculoskeletal:  Positive for arthralgias. Negative for gait problem.  Skin:  Negative for rash.  Allergic/Immunologic: Negative for environmental allergies.  Neurological:  Negative for syncope, weakness and headaches.  Hematological:  Negative for adenopathy. Does not bruise/bleed easily.  Psychiatric/Behavioral:  Negative for confusion. The patient is not nervous/anxious.   All other systems reviewed and are negative.  Current Outpatient Medications on File  Prior to Visit  Medication Sig Dispense Refill   losartan (COZAAR) 50 MG tablet TAKE 1 TABLET BY MOUTH EVERY DAY 90 tablet 2   No current facility-administered medications on file prior to visit.   Past Medical  History:  Diagnosis Date   Arthritis    Gout    Hyperlipidemia    Hypertension    History reviewed. No pertinent surgical history.  Allergies  Allergen Reactions   Penicillins    Family History  Problem Relation Age of Onset   Arthritis Mother    Diabetes Mother    Cancer Father        prostate   Hypertension Father    Hyperlipidemia Father    Cancer Paternal Grandmother        breast.   Social History   Socioeconomic History   Marital status: Married    Spouse name: Not on file   Number of children: Not on file   Years of education: Not on file   Highest education level: Not on file  Occupational History   Not on file  Tobacco Use   Smoking status: Never   Smokeless tobacco: Never  Substance and Sexual Activity   Alcohol use: No    Alcohol/week: 0.0 standard drinks of alcohol   Drug use: No   Sexual activity: Not on file  Other Topics Concern   Not on file  Social History Narrative   Not on file   Social Determinants of Health   Financial Resource Strain: Not on file  Food Insecurity: Not on file  Transportation Needs: Not on file  Physical Activity: Not on file  Stress: Not on file  Social Connections: Not on file   Vitals:   05/28/22 0750  BP: 122/80  Pulse: 75  Resp: 12  SpO2: 98%   Body mass index is 28.37 kg/m. Wt Readings from Last 3 Encounters:  05/28/22 215 lb (97.5 kg)  05/24/21 213 lb 4 oz (96.7 kg)  05/23/20 211 lb (95.7 kg)  Physical Exam Vitals and nursing note reviewed.  Constitutional:      General: He is not in acute distress.    Appearance: He is well-developed and well-groomed.  HENT:     Head: Normocephalic and atraumatic.     Right Ear: Tympanic membrane, ear canal and external ear normal.     Left Ear: Tympanic membrane, ear canal and external ear normal.     Mouth/Throat:     Mouth: Mucous membranes are moist.     Pharynx: Oropharynx is clear.  Eyes:     Extraocular Movements: Extraocular movements intact.      Conjunctiva/sclera: Conjunctivae normal.     Pupils: Pupils are equal, round, and reactive to light.  Neck:     Thyroid: No thyroid mass or thyromegaly.   Cardiovascular:     Rate and Rhythm: Normal rate and regular rhythm.     Pulses:          Dorsalis pedis pulses are 2+ on the right side and 2+ on the left side.     Heart sounds: No murmur heard. Pulmonary:     Effort: Pulmonary effort is normal. No respiratory distress.     Breath sounds: Normal breath sounds.  Abdominal:     Palpations: Abdomen is soft. There is no hepatomegaly or mass.     Tenderness: There is no abdominal tenderness.  Genitourinary:    Comments: No concerns. Musculoskeletal:        General: No tenderness.  Cervical back: Normal range of motion.     Comments: No major deformities appreciated and no signs of synovitis.  Lymphadenopathy:     Head:     Right side of head: No submandibular adenopathy.     Left side of head: No submandibular adenopathy.     Cervical: No cervical adenopathy.     Upper Body:     Right upper body: No supraclavicular adenopathy.     Left upper body: No supraclavicular adenopathy.     Comments: Small cervical lymph node palpable, bilateral,< 1 cm.  Skin:    General: Skin is warm.     Findings: No erythema.  Neurological:     General: No focal deficit present.     Mental Status: He is alert and oriented to person, place, and time.     Cranial Nerves: No cranial nerve deficit.     Sensory: No sensory deficit.     Motor: No weakness.     Gait: Gait normal.     Deep Tendon Reflexes:     Reflex Scores:      Bicep reflexes are 2+ on the right side and 2+ on the left side.      Patellar reflexes are 2+ on the right side and 2+ on the left side. Psychiatric:        Mood and Affect: Mood and affect normal.   ASSESSMENT AND PLAN:  Mr. Hoby was seen today for annual exam.  Diagnoses and all orders for this visit: Orders Placed This Encounter  Procedures   DG Chest 2 View    PSA(Must document that pt has been informed of limitations of PSA testing.)   CBC   Comprehensive metabolic panel   Hemoglobin A1c   Lipid panel   TSH   Uric acid   Ambulatory referral to Orthopedic Surgery   Lab Results  Component Value Date   TSH 1.11 05/28/2022   Lab Results  Component Value Date   CREATININE 1.26 05/28/2022   BUN 17 05/28/2022   NA 140 05/28/2022   K 4.4 05/28/2022   CL 105 05/28/2022   CO2 26 05/28/2022   Lab Results  Component Value Date   ALT 34 05/28/2022   AST 23 05/28/2022   ALKPHOS 78 05/28/2022   BILITOT 1.2 05/28/2022   Lab Results  Component Value Date   CHOL 142 05/28/2022   HDL 38.50 (L) 05/28/2022   LDLCALC 81 05/28/2022   TRIG 110.0 05/28/2022   CHOLHDL 4 05/28/2022   Lab Results  Component Value Date   HGBA1C 5.8 05/28/2022   Lab Results  Component Value Date   WBC 5.3 05/28/2022   HGB 16.5 05/28/2022   HCT 48.4 05/28/2022   MCV 91.2 05/28/2022   PLT 194.0 05/28/2022   Lab Results   PSA 0.59 05/28/2022   Routine general medical examination at a health care facility We discussed the importance of regular physical activity and healthy diet for prevention of chronic illness and/or complications. Preventive guidelines reviewed. Vaccination up to date. Next CPE in a year.  The 10-year ASCVD risk score (Arnett DK, et al., 2019) is: 9.7%   Values used to calculate the score:     Age: 16 years     Sex: Male     Is Non-Hispanic African American: No     Diabetic: No     Tobacco smoker: No     Systolic Blood Pressure: 237 mmHg     Is BP treated: Yes  HDL Cholesterol: 38.5 mg/dL     Total Cholesterol: 142 mg/dL  Prostate cancer screening -     PSA(Must document that pt has been informed of limitations of PSA testing.)  Screening for endocrine, metabolic and immunity disorder -     Hemoglobin A1c -     Comprehensive metabolic panel  Chronic cough We discussed possible etiologies. Allergies and GERD among some  to consider. He agrees with PPI trial, Omeprazole 40 mg daily x 6-8 weeks. Monitor for new symptoms. CXR ordered today.  Chronic pain of both knees OA most likely and getting worse. Ortho referral placed.  Gouty arthritis of toe of left foot He prefers not to add Allopurinol at this time. Continue Colchicine 0.6 mg as needed. He understands he needs to hold on statin while taking medication. Low purine diet also recommended.  Hypertension, essential, benign BP adequately controlled. Continue current management: Losartan 50 mg daily. DASH/low salt diet to continue Continue monitoring BP at home. Eye exam is current.  Hyperlipidemia Continue Atorvastatin 20 mg daily and low fat diet. Further recommendations according to FLP result.  Return in 1 year (on 05/29/2023) for CPE and f/u.  Enzo Treu G. Martinique, MD  [facility]. [place] office.

## 2022-05-28 ENCOUNTER — Encounter: Payer: Self-pay | Admitting: Family Medicine

## 2022-05-28 ENCOUNTER — Ambulatory Visit (INDEPENDENT_AMBULATORY_CARE_PROVIDER_SITE_OTHER): Payer: BC Managed Care – PPO

## 2022-05-28 ENCOUNTER — Encounter: Payer: BC Managed Care – PPO | Admitting: Family Medicine

## 2022-05-28 ENCOUNTER — Ambulatory Visit (INDEPENDENT_AMBULATORY_CARE_PROVIDER_SITE_OTHER): Payer: BC Managed Care – PPO | Admitting: Family Medicine

## 2022-05-28 VITALS — BP 122/80 | HR 75 | Resp 12 | Ht 73.0 in | Wt 215.0 lb

## 2022-05-28 DIAGNOSIS — Z13228 Encounter for screening for other metabolic disorders: Secondary | ICD-10-CM

## 2022-05-28 DIAGNOSIS — R059 Cough, unspecified: Secondary | ICD-10-CM | POA: Diagnosis not present

## 2022-05-28 DIAGNOSIS — M109 Gout, unspecified: Secondary | ICD-10-CM | POA: Diagnosis not present

## 2022-05-28 DIAGNOSIS — E785 Hyperlipidemia, unspecified: Secondary | ICD-10-CM

## 2022-05-28 DIAGNOSIS — R053 Chronic cough: Secondary | ICD-10-CM

## 2022-05-28 DIAGNOSIS — Z130 Encounter for screening for diseases of the blood and blood-forming organs and certain disorders involving the immune mechanism: Secondary | ICD-10-CM

## 2022-05-28 DIAGNOSIS — Z1329 Encounter for screening for other suspected endocrine disorder: Secondary | ICD-10-CM

## 2022-05-28 DIAGNOSIS — Z125 Encounter for screening for malignant neoplasm of prostate: Secondary | ICD-10-CM | POA: Diagnosis not present

## 2022-05-28 DIAGNOSIS — Z0000 Encounter for general adult medical examination without abnormal findings: Secondary | ICD-10-CM

## 2022-05-28 DIAGNOSIS — G8929 Other chronic pain: Secondary | ICD-10-CM

## 2022-05-28 DIAGNOSIS — M1A9XX0 Chronic gout, unspecified, without tophus (tophi): Secondary | ICD-10-CM

## 2022-05-28 DIAGNOSIS — M25562 Pain in left knee: Secondary | ICD-10-CM

## 2022-05-28 DIAGNOSIS — I10 Essential (primary) hypertension: Secondary | ICD-10-CM | POA: Diagnosis not present

## 2022-05-28 DIAGNOSIS — Z0001 Encounter for general adult medical examination with abnormal findings: Secondary | ICD-10-CM | POA: Diagnosis not present

## 2022-05-28 DIAGNOSIS — M25561 Pain in right knee: Secondary | ICD-10-CM

## 2022-05-28 LAB — CBC
HCT: 48.4 % (ref 39.0–52.0)
Hemoglobin: 16.5 g/dL (ref 13.0–17.0)
MCHC: 34.1 g/dL (ref 30.0–36.0)
MCV: 91.2 fl (ref 78.0–100.0)
Platelets: 194 10*3/uL (ref 150.0–400.0)
RBC: 5.31 Mil/uL (ref 4.22–5.81)
RDW: 12.3 % (ref 11.5–15.5)
WBC: 5.3 10*3/uL (ref 4.0–10.5)

## 2022-05-28 LAB — LIPID PANEL
Cholesterol: 142 mg/dL (ref 0–200)
HDL: 38.5 mg/dL — ABNORMAL LOW (ref >39.00)
LDL Cholesterol: 81 mg/dL (ref 0–99)
NonHDL: 103.07
Total CHOL/HDL Ratio: 4
Triglycerides: 110 mg/dL (ref 0.0–149.0)
VLDL: 22 mg/dL (ref 0.0–40.0)

## 2022-05-28 LAB — COMPREHENSIVE METABOLIC PANEL
ALT: 34 U/L (ref 0–53)
AST: 23 U/L (ref 0–37)
Albumin: 4 g/dL (ref 3.5–5.2)
Alkaline Phosphatase: 78 U/L (ref 39–117)
BUN: 17 mg/dL (ref 6–23)
CO2: 26 mEq/L (ref 19–32)
Calcium: 9.6 mg/dL (ref 8.4–10.5)
Chloride: 105 mEq/L (ref 96–112)
Creatinine, Ser: 1.26 mg/dL (ref 0.40–1.50)
GFR: 60.97 mL/min (ref >60.00)
Glucose, Bld: 113 mg/dL — ABNORMAL HIGH (ref 70–99)
Potassium: 4.4 mEq/L (ref 3.5–5.1)
Sodium: 140 mEq/L (ref 135–145)
Total Bilirubin: 1.2 mg/dL (ref 0.2–1.2)
Total Protein: 6.7 g/dL (ref 6.0–8.3)

## 2022-05-28 LAB — PSA: PSA: 0.59 ng/mL (ref 0.10–4.00)

## 2022-05-28 LAB — TSH: TSH: 1.11 u[IU]/mL (ref 0.35–5.50)

## 2022-05-28 LAB — HEMOGLOBIN A1C: Hgb A1c MFr Bld: 5.8 % (ref 4.6–6.5)

## 2022-05-28 LAB — URIC ACID: Uric Acid, Serum: 8.5 mg/dL — ABNORMAL HIGH (ref 4.0–7.8)

## 2022-05-28 MED ORDER — COLCHICINE 0.6 MG PO TABS: 0.6000 mg | ORAL_TABLET | Freq: Every day | ORAL | 8 refills | Status: DC | PRN

## 2022-05-28 NOTE — Patient Instructions (Addendum)
A few things to remember from today's visit:   Routine general medical examination at a health care facility  Hyperlipidemia, unspecified hyperlipidemia type - Plan: Lipid panel  Hypertension, essential, benign - Plan: CBC, TSH  Prostate cancer screening - Plan: PSA(Must document that pt has been informed of limitations of PSA testing.)  Screening for endocrine, metabolic and immunity disorder - Plan: Comprehensive metabolic panel, Hemoglobin A1c  Chronic cough - Plan: DG Chest 2 View  Chronic pain of both knees - Plan: Ambulatory referral to Orthopedic Surgery  Chronic gout, unspecified cause, unspecified site - Plan: Uric acid  Gout, arthropathy - Plan: colchicine 0.6 MG tablet  If you need refills for medications you take chronically, please call your pharmacy. Do not use My Chart to request refills or for acute issues that need immediate attention. If you send a my chart message, it may take a few days to be addressed, specially if I am not in the office.  Please be sure medication list is accurate. If a new problem present, please set up appointment sooner than planned today.  Omeprazole 40 mg daily , 30 min before breakfast for 6-8 weeks to treat acid reflux, which could be causing cough. No changes in rest.  Health Maintenance, Male Adopting a healthy lifestyle and getting preventive care are important in promoting health and wellness. Ask your health care provider about: The right schedule for you to have regular tests and exams. Things you can do on your own to prevent diseases and keep yourself healthy. What should I know about diet, weight, and exercise? Eat a healthy diet  Eat a diet that includes plenty of vegetables, fruits, low-fat dairy products, and lean protein. Do not eat a lot of foods that are high in solid fats, added sugars, or sodium. Maintain a healthy weight Body mass index (BMI) is a measurement that can be used to identify possible weight  problems. It estimates body fat based on height and weight. Your health care provider can help determine your BMI and help you achieve or maintain a healthy weight. Get regular exercise Get regular exercise. This is one of the most important things you can do for your health. Most adults should: Exercise for at least 150 minutes each week. The exercise should increase your heart rate and make you sweat (moderate-intensity exercise). Do strengthening exercises at least twice a week. This is in addition to the moderate-intensity exercise. Spend less time sitting. Even light physical activity can be beneficial. Watch cholesterol and blood lipids Have your blood tested for lipids and cholesterol at 63 years of age, then have this test every 5 years. You may need to have your cholesterol levels checked more often if: Your lipid or cholesterol levels are high. You are older than 63 years of age. You are at high risk for heart disease. What should I know about cancer screening? Many types of cancers can be detected early and may often be prevented. Depending on your health history and family history, you may need to have cancer screening at various ages. This may include screening for: Colorectal cancer. Prostate cancer. Skin cancer. Lung cancer. What should I know about heart disease, diabetes, and high blood pressure? Blood pressure and heart disease High blood pressure causes heart disease and increases the risk of stroke. This is more likely to develop in people who have high blood pressure readings or are overweight. Talk with your health care provider about your target blood pressure readings. Have your blood pressure  checked: Every 3-5 years if you are 60-19 years of age. Every year if you are 44 years old or older. If you are between the ages of 63 and 55 and are a current or former smoker, ask your health care provider if you should have a one-time screening for abdominal aortic aneurysm  (AAA). Diabetes Have regular diabetes screenings. This checks your fasting blood sugar level. Have the screening done: Once every three years after age 5 if you are at a normal weight and have a low risk for diabetes. More often and at a younger age if you are overweight or have a high risk for diabetes. What should I know about preventing infection? Hepatitis B If you have a higher risk for hepatitis B, you should be screened for this virus. Talk with your health care provider to find out if you are at risk for hepatitis B infection. Hepatitis C Blood testing is recommended for: Everyone born from [birth year] through 1967. Anyone with known risk factors for hepatitis C. Sexually transmitted infections (STIs) You should be screened each year for STIs, including gonorrhea and chlamydia, if: You are sexually active and are younger than 63 years of age. You are older than 63 years of age and your health care provider tells you that you are at risk for this type of infection. Your sexual activity has changed since you were last screened, and you are at increased risk for chlamydia or gonorrhea. Ask your health care provider if you are at risk. Ask your health care provider about whether you are at high risk for HIV. Your health care provider may recommend a prescription medicine to help prevent HIV infection. If you choose to take medicine to prevent HIV, you should first get tested for HIV. You should then be tested every 3 months for as long as you are taking the medicine. Follow these instructions at home: Alcohol use Do not drink alcohol if your health care provider tells you not to drink. If you drink alcohol: Limit how much you have to 0-2 drinks a day. Know how much alcohol is in your drink. In the [place] one drink equals one 12 oz bottle of beer (355 mL), one 5 oz glass of wine (148 mL), or one 1 oz glass of hard liquor (44 mL). Lifestyle Do not use any products that contain nicotine or  tobacco. These products include cigarettes, chewing tobacco, and vaping devices, such as e-cigarettes. If you need help quitting, ask your health care provider. Do not use street drugs. Do not share needles. Ask your health care provider for help if you need support or information about quitting drugs. General instructions Schedule regular health, dental, and eye exams. Stay current with your vaccines. Tell your health care provider if: You often feel depressed. You have ever been abused or do not feel safe at home. Summary Adopting a healthy lifestyle and getting preventive care are important in promoting health and wellness. Follow your health care provider's instructions about healthy diet, exercising, and getting tested or screened for diseases. Follow your health care provider's instructions on monitoring your cholesterol and blood pressure. This information is not intended to replace advice given to you by your health care provider. Make sure you discuss any questions you have with your health care provider. Document Revised: 01/16/2021 Document Reviewed: 01/16/2021 Elsevier Patient Education  [employer].

## 2022-05-28 NOTE — Assessment & Plan Note (Signed)
He prefers not to add Allopurinol at this time. Continue Colchicine 0.6 mg as needed. He understands he needs to hold on statin while taking medication. Low purine diet also recommended.

## 2022-05-28 NOTE — Assessment & Plan Note (Signed)
BP adequately controlled. Continue current management: Losartan 50 mg daily. DASH/low salt diet to continue Continue monitoring BP at home. Eye exam is current.

## 2022-05-28 NOTE — Assessment & Plan Note (Signed)
Continue Atorvastatin 20 mg daily and low fat diet. Further recommendations according to FLP result. 

## 2022-05-31 ENCOUNTER — Encounter: Payer: Self-pay | Admitting: Family Medicine

## 2022-05-31 MED ORDER — ATORVASTATIN CALCIUM 20 MG PO TABS: ORAL_TABLET | ORAL | 3 refills | Status: DC

## 2022-05-31 MED ORDER — OMEPRAZOLE 40 MG PO CPDR: 40.0000 mg | DELAYED_RELEASE_CAPSULE | Freq: Every day | ORAL | 0 refills | Status: DC

## 2022-06-26 DIAGNOSIS — M25761 Osteophyte, right knee: Secondary | ICD-10-CM | POA: Diagnosis not present

## 2022-06-26 DIAGNOSIS — M25762 Osteophyte, left knee: Secondary | ICD-10-CM | POA: Diagnosis not present

## 2022-06-26 DIAGNOSIS — M170 Bilateral primary osteoarthritis of knee: Secondary | ICD-10-CM | POA: Diagnosis not present

## 2022-07-06 DIAGNOSIS — L738 Other specified follicular disorders: Secondary | ICD-10-CM | POA: Diagnosis not present

## 2022-07-06 DIAGNOSIS — H61001 Unspecified perichondritis of right external ear: Secondary | ICD-10-CM | POA: Diagnosis not present

## 2022-07-06 DIAGNOSIS — D229 Melanocytic nevi, unspecified: Secondary | ICD-10-CM | POA: Diagnosis not present

## 2022-07-06 DIAGNOSIS — L309 Dermatitis, unspecified: Secondary | ICD-10-CM | POA: Diagnosis not present

## 2022-08-26 ENCOUNTER — Other Ambulatory Visit: Payer: Self-pay | Admitting: Family Medicine

## 2022-10-16 DIAGNOSIS — H40023 Open angle with borderline findings, high risk, bilateral: Secondary | ICD-10-CM | POA: Diagnosis not present

## 2022-10-31 ENCOUNTER — Encounter: Payer: Self-pay | Admitting: Family Medicine

## 2022-10-31 NOTE — Progress Notes (Unsigned)
ACUTE VISIT Chief Complaint  Patient presents with   Cough    Ongoing since January, phlegm present.   HPI: Joseph Wheeler is a 62 y.o. male with PMHx significant for gout,HLD,HTN,and IFG here today complaining of persistent cough since January.  He reports that the cough is associated with sputum production and irritation in the throat, but denies any difficulty swallowing or voice changes.  The cough is most prominent in the morning upon waking and in the evening, with occasional episodes during the day.  Cough This is a recurrent problem. The current episode started more than 1 month ago. The problem has been unchanged. The problem occurs every few hours. The cough is Productive of sputum. Associated symptoms include postnasal drip. Pertinent negatives include no chest pain, chills, ear congestion, eye redness, fever, heartburn, hemoptysis, myalgias, nasal congestion, rash, rhinorrhea, sore throat, shortness of breath, sweats, weight loss or wheezing. There is no history of environmental allergies.   His cough began during an acute illness, specifically a cold that lasted for three to four days.  He occasionally feels as if there is something stuck in their throat, coughing provides temporary relief. He has been taking omeprazole since January without significant improvement in their cough.  He has had cough for over a year, which has been intermittent and minimally productive.   He has no history of smoking, and a previous chest x-ray in 05/2022 showed clear lungs.  He has not taken any over-the-counter medications for the cough.  Review of Systems  Constitutional:  Negative for chills, fever and weight loss.  HENT:  Positive for postnasal drip. Negative for facial swelling, mouth sores, rhinorrhea and sore throat.   Eyes:  Negative for discharge and redness.  Respiratory:  Positive for cough. Negative for hemoptysis, shortness of breath and wheezing.   Cardiovascular:   Negative for chest pain and leg swelling.  Gastrointestinal:  Negative for abdominal pain, heartburn, nausea and vomiting.  Musculoskeletal:  Negative for myalgias.  Skin:  Negative for rash.  Allergic/Immunologic: Negative for environmental allergies.  Hematological:  Negative for adenopathy. Does not bruise/bleed easily.  See other pertinent positives and negatives in HPI.  Current Outpatient Medications on File Prior to Visit  Medication Sig Dispense Refill   atorvastatin (LIPITOR) 20 MG tablet TAKE 1 TABLET BY MOUTH WITH SUPPER 90 tablet 3   colchicine 0.6 MG tablet Take 1 tablet (0.6 mg total) by mouth daily as needed. 30 tablet 8   losartan (COZAAR) 50 MG tablet TAKE 1 TABLET BY MOUTH EVERY DAY 90 tablet 2   omeprazole (PRILOSEC) 40 MG capsule TAKE 1 CAPSULE (40 MG TOTAL) BY MOUTH DAILY. 90 capsule 3   No current facility-administered medications on file prior to visit.   Past Medical History:  Diagnosis Date   Arthritis    Gout    Hyperlipidemia    Hypertension    Allergies  Allergen Reactions   Penicillins    Social History   Socioeconomic History   Marital status: Married    Spouse name: Not on file   Number of children: Not on file   Years of education: Not on file   Highest education level: Not on file  Occupational History   Not on file  Tobacco Use   Smoking status: Never   Smokeless tobacco: Never  Substance and Sexual Activity   Alcohol use: No    Alcohol/week: 0.0 standard drinks of alcohol   Drug use: No   Sexual activity: Not on  file  Other Topics Concern   Not on file  Social History Narrative   Not on file   Social Determinants of Health   Financial Resource Strain: Not on file  Food Insecurity: Not on file  Transportation Needs: Not on file  Physical Activity: Not on file  Stress: Not on file  Social Connections: Not on file   Vitals:   11/02/22 0820  BP: 136/80  Pulse: 95  Resp: 16  Temp: 98.2 F (36.8 C)  SpO2: 97%   Body  mass index is 28.55 kg/m.  Physical Exam Vitals and nursing note reviewed.  Constitutional:      General: He is not in acute distress.    Appearance: He is well-developed. He is not ill-appearing.  HENT:     Head: Normocephalic and atraumatic.     Right Ear: Tympanic membrane, ear canal and external ear normal.     Left Ear: Tympanic membrane, ear canal and external ear normal.     Nose: No congestion or rhinorrhea.     Right Sinus: No maxillary sinus tenderness or frontal sinus tenderness.     Left Sinus: No maxillary sinus tenderness or frontal sinus tenderness.  Eyes:     Conjunctiva/sclera: Conjunctivae normal.  Cardiovascular:     Rate and Rhythm: Normal rate and regular rhythm.     Heart sounds: No murmur heard. Pulmonary:     Effort: Pulmonary effort is normal. No respiratory distress.     Breath sounds: Normal breath sounds. No stridor.  Abdominal:     Palpations: Abdomen is soft. There is no mass.     Tenderness: There is no abdominal tenderness.  Lymphadenopathy:     Cervical: No cervical adenopathy.  Skin:    General: Skin is warm.     Findings: No erythema or rash.  Neurological:     Mental Status: He is alert and oriented to person, place, and time.  Psychiatric:        Mood and Affect: Mood and affect normal.   ASSESSMENT AND PLAN: Joseph Wheeler was seen today for post nasal drip and cough.  Post-nasal drainage Differential diagnosis discussed. He agrees with trying trial of Flonase nasal spray at bedtime for 10 to 14 days then as needed. Nasal saline irrigations will also help. Monitor for new symptoms. Follow-up as needed.  -     Fluticasone Propionate; Place 1 spray into both nostrils 2 (two) times daily.  Dispense: 16 g; Refill: 0  Chronic cough We discussed possible etiologies. He has been coughing for over a year, PPI and antihistaminic did not help. I think it is appropriate to obtain and chest CT to evaluate for other possible causes.  -     CT  CHEST WO CONTRAST; Future  Return if symptoms worsen or fail to improve, for keep next appointment.  Holdan Stucke G. Martinique, MD  [facility]. [place] office.

## 2022-11-02 ENCOUNTER — Ambulatory Visit (INDEPENDENT_AMBULATORY_CARE_PROVIDER_SITE_OTHER): Payer: BC Managed Care – PPO | Admitting: Family Medicine

## 2022-11-02 ENCOUNTER — Encounter: Payer: Self-pay | Admitting: Family Medicine

## 2022-11-02 VITALS — BP 136/80 | HR 95 | Temp 98.2°F | Resp 16 | Ht 73.0 in | Wt 216.4 lb

## 2022-11-02 DIAGNOSIS — R0982 Postnasal drip: Secondary | ICD-10-CM

## 2022-11-02 DIAGNOSIS — R053 Chronic cough: Secondary | ICD-10-CM | POA: Diagnosis not present

## 2022-11-02 MED ORDER — FLUTICASONE PROPIONATE 50 MCG/ACT NA SUSP: 1.0000 | Freq: Two times a day (BID) | NASAL | 0 refills | Status: AC

## 2022-11-02 NOTE — Patient Instructions (Addendum)
A few things to remember from today's visit:  Chronic cough - Plan: CT Chest Wo Contrast  Post-nasal drainage - Plan: fluticasone (FLONASE) 50 MCG/ACT nasal spray  Try Flonase nasal spray at bedtime for 10-14 days. Chest CT is going to be arrange due to persistent cough.  If you need refills for medications you take chronically, please call your pharmacy. Do not use My Chart to request refills or for acute issues that need immediate attention. If you send a my chart message, it may take a few days to be addressed, specially if I am not in the office.  Please be sure medication list is accurate. If a new problem present, please set up appointment sooner than planned today.

## 2022-11-22 NOTE — Telephone Encounter (Signed)
Please advise 

## 2022-11-23 ENCOUNTER — Other Ambulatory Visit: Payer: Self-pay | Admitting: Family Medicine

## 2022-11-23 DIAGNOSIS — R053 Chronic cough: Secondary | ICD-10-CM

## 2023-01-03 DIAGNOSIS — R09A2 Foreign body sensation, throat: Secondary | ICD-10-CM | POA: Diagnosis not present

## 2023-01-03 DIAGNOSIS — R0982 Postnasal drip: Secondary | ICD-10-CM | POA: Diagnosis not present

## 2023-01-03 DIAGNOSIS — R059 Cough, unspecified: Secondary | ICD-10-CM | POA: Diagnosis not present

## 2023-01-23 ENCOUNTER — Other Ambulatory Visit: Payer: Self-pay | Admitting: Family Medicine

## 2023-01-23 ENCOUNTER — Telehealth: Payer: BC Managed Care – PPO | Admitting: Physician Assistant

## 2023-01-23 DIAGNOSIS — I10 Essential (primary) hypertension: Secondary | ICD-10-CM

## 2023-01-23 DIAGNOSIS — M545 Low back pain, unspecified: Secondary | ICD-10-CM

## 2023-01-23 MED ORDER — CYCLOBENZAPRINE HCL 10 MG PO TABS: 5.0000 mg | ORAL_TABLET | Freq: Three times a day (TID) | ORAL | 0 refills | Status: AC | PRN

## 2023-01-23 MED ORDER — NAPROXEN 500 MG PO TABS: 500.0000 mg | ORAL_TABLET | Freq: Two times a day (BID) | ORAL | 0 refills | Status: DC

## 2023-01-23 NOTE — Progress Notes (Signed)
We are sorry that you are not feeling well.  Here is how we plan to help!  Based on what you have shared with me it looks like you mostly have acute back pain.  Acute back pain is defined as musculoskeletal pain that can resolve in 1-3 weeks with conservative treatment.  I have prescribed Naprosyn 500 mg take one by mouth twice a day non-steroid anti-inflammatory (NSAID) as well as Flexeril 10 mg every eight hours as needed which is a muscle relaxer  Some patients experience stomach irritation or in increased heartburn with anti-inflammatory drugs.  Please keep in mind that muscle relaxer's can cause fatigue and should not be taken while at work or driving.  Back pain is very common.  The pain often gets better over time.  The cause of back pain is usually not dangerous.  Most people can learn to manage their back pain on their own.  Would also recommend OTC Gas-X for bloated feeling. If have not had a bowel movement, could add in a stool softener and laxative like Miralax or Dulcolax.  Home Care Stay active.  Start with short walks on flat ground if you can.  Try to walk farther each day. Do not sit, drive or stand in one place for more than 30 minutes.  Do not stay in bed. Do not avoid exercise or work.  Activity can help your back heal faster. Be careful when you bend or lift an object.  Bend at your knees, keep the object close to you, and do not twist. Sleep on a firm mattress.  Lie on your side, and bend your knees.  If you lie on your back, put a pillow under your knees. Only take medicines as told by your doctor. Put ice on the injured area. Put ice in a plastic bag Place a towel between your skin and the bag Leave the ice on for 15-20 minutes, 3-4 times a day for the first 2-3 days. 210 After that, you can switch between ice and heat packs. Ask your doctor about back exercises or massage. Avoid feeling anxious or stressed.  Find good ways to deal with stress, such as exercise.  Get  Help Right Way If: Your pain does not go away with rest or medicine. Your pain does not go away in 1 week. You have new problems. You do not feel well. The pain spreads into your legs. You cannot control when you poop (bowel movement) or pee (urinate) You feel sick to your stomach (nauseous) or throw up (vomit) You have belly (abdominal) pain. You feel like you may pass out (faint). If you develop a fever.  Make Sure you: Understand these instructions. Will watch your condition Will get help right away if you are not doing well or get worse.  Your e-visit answers were reviewed by a board certified advanced clinical practitioner to complete your personal care plan.  Depending on the condition, your plan could have included both over the counter or prescription medications.  If there is a problem please reply  once you have received a response from your provider.  Your safety is important to [place].  If you have drug allergies check your prescription carefully.    You can use MyChart to ask questions about today's visit, request a non-urgent call back, or ask for a work or school excuse for 24 hours related to this e-Visit. If it has been greater than 24 hours you will need to follow up with your provider,  or enter a new e-Visit to address those concerns.  You will get an e-mail in the next two days asking about your experience.  I hope that your e-visit has been valuable and will speed your recovery. Thank you for using e-visits.  I have spent 5 minutes in review of e-visit questionnaire, review and updating patient chart, medical decision making and response to patient.   Margaretann Loveless, PA-C

## 2023-03-18 DIAGNOSIS — H40023 Open angle with borderline findings, high risk, bilateral: Secondary | ICD-10-CM | POA: Diagnosis not present

## 2023-06-11 NOTE — Progress Notes (Signed)
HPI: Joseph Wheeler is a 62 y.o.male with a PMHx significant for HTN, HLD, gout, and impaired fasting glucose here today for his routine physical examination.  Last CPE: 05/28/2022 He saw someone for low back pain in 01/2023, and states that it has since improved.  Exercise: He is still doing tai chi and walking 5x per week for 1.5 miles. Diet: He is cooking at home eating mostly chicken and fish, with vegetables daily. He snacks on a handful of peanuts.  Sleep: He reports about 7 hours of sleep per night.  Alcohol Use: He says he drinks cocktails 2-3 times per week. Smoking: Never Vision: UTD on routine vision care. Dental: UTD on routine dental care.   Immunization History  Administered Date(s) Administered  . Influenza,inj,Quad PF,6+ Mos 07/03/2016  . Influenza-Unspecified 07/17/2017, 07/01/2018, 06/09/2019, 06/22/2020, 06/21/2021, 05/28/2023  . PFIZER(Purple Top)SARS-COV-2 Vaccination 11/24/2019, 12/22/2019, 08/02/2020, 12/30/2020  . Research officer, trade union 5y-11y 06/27/2021  . Tdap 06/12/2023  . Zoster Recombinant(Shingrix) 05/23/2020, 08/02/2020    Health Maintenance  Topic Date Due  . COVID-19 Vaccine (6 - 2023-24 season) 06/28/2023 (Originally 05/12/2023)  . Colonoscopy  10/17/2023  . DTaP/Tdap/Td (2 - Td or Tdap) 06/11/2033  . INFLUENZA VACCINE  Completed  . Hepatitis C Screening  Completed  . HIV Screening  Completed  . Zoster Vaccines- Shingrix  Completed  . HPV VACCINES  Aged Out   Last prostate ca screening: 05/28/2022 He reports he goes to the bathroom before going to sleep, but not during the night.   Chronic medical problems:   Hypertension: He is currently taking losartan 50 mg daily BP readings at home: He says he has been checking at home, and his readings have been 130s/80s.  Side effects: none Negative for unusual or severe headache, visual changes, exertional chest pain, dyspnea,  focal weakness, or edema.  Lab Results   Component Value Date   CREATININE 1.26 05/28/2022   BUN 17 05/28/2022   NA 140 05/28/2022   K 4.4 05/28/2022   CL 105 05/28/2022   CO2 26 05/28/2022   Hyperlipidemia: Currently on atorvastatin 20 mg daily.  Side effects from medication: none Lab Results  Component Value Date   CHOL 142 05/28/2022   HDL 38.50 (L) 05/28/2022   LDLCALC 81 05/28/2022   TRIG 110.0 05/28/2022   CHOLHDL 4 05/28/2022   Gout: He takes colchicine 0.6 mg prn when he has gout attacks.  He says he uses it about 3-4 times per year.  Lab Results  Component Value Date   LABURIC 8.5 (H) 05/28/2022   Concerns and/or follow up today:   He mentions he went to see ENT for his cough.  He was told to take omeprazole 40 mg for 2 weeks, and states there has been significant improvement. He now takes omeprazole prn.   Review of Systems  Constitutional:  Negative for activity change, appetite change and fever.  HENT:  Negative for nosebleeds, sore throat and trouble swallowing.   Eyes:  Negative for redness and visual disturbance.  Respiratory:  Negative for cough, shortness of breath and wheezing.   Cardiovascular:  Negative for chest pain, palpitations and leg swelling.  Gastrointestinal:  Negative for abdominal pain, blood in stool, nausea and vomiting.  Endocrine: Negative for cold intolerance, heat intolerance, polydipsia, polyphagia and polyuria.  Genitourinary:  Negative for decreased urine volume, dysuria, genital sores, hematuria and testicular pain.  Musculoskeletal:  Positive for arthralgias. Negative for gait problem and myalgias.  Skin:  Negative for color change and rash.  Neurological:  Negative for syncope, weakness, numbness and headaches.  Hematological:  Negative for adenopathy. Does not bruise/bleed easily.  Psychiatric/Behavioral:  Negative for confusion. The patient is not nervous/anxious.   All other systems reviewed and are negative.  Current Outpatient Medications on File Prior to Visit   Medication Sig Dispense Refill  . atorvastatin (LIPITOR) 20 MG tablet TAKE 1 TABLET BY MOUTH WITH SUPPER 90 tablet 3  . cyclobenzaprine (FLEXERIL) 10 MG tablet Take 0.5-1 tablets (5-10 mg total) by mouth 3 (three) times daily as needed. 30 tablet 0  . fluticasone (FLONASE) 50 MCG/ACT nasal spray Place 1 spray into both nostrils 2 (two) times daily. 16 g 0  . naproxen (NAPROSYN) 500 MG tablet Take 1 tablet (500 mg total) by mouth 2 (two) times daily with a meal. 30 tablet 0   No current facility-administered medications on file prior to visit.   Past Medical History:  Diagnosis Date  . Arthritis   . Gout   . Hyperlipidemia   . Hypertension    History reviewed. No pertinent surgical history.  Allergies  Allergen Reactions  . Penicillins    Family History  Problem Relation Age of Onset  . Arthritis Mother   . Diabetes Mother   . Cancer Father        prostate  . Hypertension Father   . Hyperlipidemia Father   . Cancer Paternal Grandmother        breast.    Social History   Socioeconomic History  . Marital status: Married    Spouse name: Not on file  . Number of children: Not on file  . Years of education: Not on file  . Highest education level: Doctorate  Occupational History  . Not on file  Tobacco Use  . Smoking status: Never  . Smokeless tobacco: Never  Substance and Sexual Activity  . Alcohol use: No    Alcohol/week: 0.0 standard drinks of alcohol  . Drug use: No  . Sexual activity: Not on file  Other Topics Concern  . Not on file  Social History Narrative  . Not on file   Social Determinants of Health   Financial Resource Strain: Low Risk  (06/08/2023)   Overall Financial Resource Strain (CARDIA)   . Difficulty of Paying Living Expenses: Not hard at all  Food Insecurity: No Food Insecurity (06/08/2023)   Hunger Vital Sign   . Worried About Programme researcher, broadcasting/film/video in the Last Year: Never true   . Ran Out of Food in the Last Year: Never true  Transportation  Needs: No Transportation Needs (06/08/2023)   PRAPARE - Transportation   . Lack of Transportation (Medical): No   . Lack of Transportation (Non-Medical): No  Physical Activity: Sufficiently Active (06/08/2023)   Exercise Vital Sign   . Days of Exercise per Week: 5 days   . Minutes of Exercise per Session: 30 min  Stress: No Stress Concern Present (06/08/2023)   [employer] of Occupational Health - Occupational Stress Questionnaire   . Feeling of Stress : Only a little  Social Connections: Socially Isolated (06/08/2023)   Social Connection and Isolation Panel [NHANES]   . Frequency of Communication with Friends and Family: Once a week   . Frequency of Social Gatherings with Friends and Family: Once a week   . Attends Religious Services: Never   . Active Member of Clubs or Organizations: No   . Attends Banker Meetings: Not on file   .  Marital Status: Married   Vitals:   06/12/23 0748  BP: 130/80  Pulse: 73  Resp: 12  Temp: 98 F (36.7 C)  SpO2: 98%   Body mass index is 27.85 kg/m.  Wt Readings from Last 3 Encounters:  06/12/23 211 lb 2 oz (95.8 kg)  11/02/22 216 lb 6 oz (98.1 kg)  05/28/22 215 lb (97.5 kg)   Physical Exam Vitals and nursing note reviewed.  Constitutional:      General: He is not in acute distress.    Appearance: He is well-developed.  HENT:     Head: Normocephalic and atraumatic.     Right Ear: Tympanic membrane, ear canal and external ear normal.     Left Ear: Tympanic membrane, ear canal and external ear normal.  Eyes:     Extraocular Movements: Extraocular movements intact.     Conjunctiva/sclera: Conjunctivae normal.     Pupils: Pupils are equal, round, and reactive to light.  Neck:     Thyroid: No thyroid mass or thyromegaly.  Cardiovascular:     Rate and Rhythm: Normal rate and regular rhythm.     Pulses:          Dorsalis pedis pulses are 2+ on the right side and 2+ on the left side.     Heart sounds: No murmur  heard. Pulmonary:     Effort: Pulmonary effort is normal. No respiratory distress.     Breath sounds: Normal breath sounds.  Abdominal:     Palpations: Abdomen is soft. There is no hepatomegaly or mass.     Tenderness: There is no abdominal tenderness.  Genitourinary:    Comments: No concerns. Musculoskeletal:        General: No tenderness.     Cervical back: Normal range of motion.     Right lower leg: No edema.     Left lower leg: No edema.     Comments: No major deformities appreciated and no signs of synovitis.  Lymphadenopathy:     Cervical: No cervical adenopathy.     Upper Body:     Right upper body: No supraclavicular adenopathy.     Left upper body: No supraclavicular adenopathy.  Skin:    General: Skin is warm.     Findings: No erythema.  Neurological:     General: No focal deficit present.     Mental Status: He is alert and oriented to person, place, and time.     Cranial Nerves: No cranial nerve deficit.     Sensory: No sensory deficit.     Gait: Gait normal.     Deep Tendon Reflexes:     Reflex Scores:      Bicep reflexes are 2+ on the right side and 2+ on the left side.      Patellar reflexes are 2+ on the right side and 2+ on the left side. Psychiatric:        Mood and Affect: Mood and affect normal.  ASSESSMENT AND PLAN:  Mr. Kellis was seen today for his routine general medical examination.   Orders Placed This Encounter  Procedures  . Tdap vaccine greater than or equal to 7yo IM  . Comprehensive metabolic panel  . Lipid panel  . Hemoglobin A1c  . PSA   Lab Results  Component Value Date   PSA 0.50 06/12/2023   PSA 0.59 05/28/2022   PSA 0.48 05/24/2021   Lab Results  Component Value Date   CHOL 151 06/12/2023   HDL 43.30 06/12/2023  LDLCALC 84 06/12/2023   TRIG 114.0 06/12/2023   CHOLHDL 3 06/12/2023   Lab Results  Component Value Date   HGBA1C 5.6 06/12/2023   Lab Results  Component Value Date   NA 138 06/12/2023   CL 106  06/12/2023   K 4.2 06/12/2023   CO2 24 06/12/2023   BUN 15 06/12/2023   CREATININE 1.20 06/12/2023   GFR 64.18 06/12/2023   CALCIUM 9.8 06/12/2023   ALBUMIN 4.1 06/12/2023   GLUCOSE 107 (H) 06/12/2023   Lab Results  Component Value Date   ALT 28 06/12/2023   AST 23 06/12/2023   ALKPHOS 80 06/12/2023   BILITOT 1.5 (H) 06/12/2023   Routine general medical examination at a health care facility Assessment & Plan: We discussed the importance of regular physical activity and healthy diet for prevention of chronic illness and/or complications. Preventive guidelines reviewed. Vaccination updated. Due for colonoscopy in 10/2023, instructed to let me know if he does not receive a letter from his GI. Next CPE in a year.   Hypertension, essential, benign Assessment & Plan: Overall BP adequately controlled, reporting similar numbers at home. Continue losartan 50 mg daily and low-salt diet. Continue monitoring BP at home, if he starts having frequent BP's >130/80, instructed to let me know , so we could increase losartan dose. Eye exam is current. As far as blood pressure is stable, annual follow-up is appropriate.  Orders: -     Comprehensive metabolic panel; Future -     Losartan Potassium; TAKE 1 TABLET BY MOUTH EVERY DAY  Dispense: 90 tablet; Refill: 3  IFG (impaired fasting glucose) Assessment & Plan: HgA1C was 5.8 in 05/2022. Encouraged to continue a healthy lifestyle for diabetes prevention. Further recommendation will be given according to A1c and glucose results.  Orders: -     Hemoglobin A1c; Future  Hyperlipidemia, unspecified hyperlipidemia type Assessment & Plan: Continue Atorvastatin 20 mg daily and low fat diet. Further recommendations according to FLP result.  Orders: -     Comprehensive metabolic panel; Future -     Lipid panel; Future  Prostate cancer screening -     PSA; Future  Need for Tdap vaccination -     Tdap vaccine greater than or equal to 7yo  IM  Gouty arthritis of toe of left foot Assessment & Plan: Having a few flare ups through the year. Continue colchicine 0.6 mg daily as needed, which has been effective. He is not interested in daily pharmacologic treatment like allopurinol. Continue low purine diet.  Orders: -     Colchicine; Take 1 tablet (0.6 mg total) by mouth daily as needed.  Dispense: 90 tablet; Refill: 1  Gastroesophageal reflux disease, unspecified whether esophagitis present Assessment & Plan: Cough has improved with Omeprazole, which he is now taking prn. No changes in current management. Continue GERD precautions.  Orders: -     Omeprazole; Take 1 capsule (40 mg total) by mouth daily.  Dispense: 90 capsule; Refill: 3   Return in 1 year (on 06/11/2024) for CPE, chronic problems.  I, Rolla Etienne Wierda, acting as a scribe for Charly Hunton [place], MD., have documented all relevant documentation on the behalf of Zimir Kittleson [place], MD, as directed by  Boykin Baetz [place], MD while in the presence of Hy Swiatek [place], MD.   I, Lakeishia Truluck [place], MD, have reviewed all documentation for this visit. The documentation on 06/12/23 for the exam, diagnosis, procedures, and orders are all accurate and complete.  Ciarra Braddy G. [place], MD  [facility].  [place] [facility].

## 2023-06-12 ENCOUNTER — Encounter: Payer: Self-pay | Admitting: Family Medicine

## 2023-06-12 ENCOUNTER — Ambulatory Visit (INDEPENDENT_AMBULATORY_CARE_PROVIDER_SITE_OTHER): Payer: 59 | Admitting: Family Medicine

## 2023-06-12 VITALS — BP 130/80 | HR 73 | Temp 98.0°F | Resp 12 | Ht 73.0 in | Wt 211.1 lb

## 2023-06-12 DIAGNOSIS — I10 Essential (primary) hypertension: Secondary | ICD-10-CM

## 2023-06-12 DIAGNOSIS — E785 Hyperlipidemia, unspecified: Secondary | ICD-10-CM

## 2023-06-12 DIAGNOSIS — Z125 Encounter for screening for malignant neoplasm of prostate: Secondary | ICD-10-CM

## 2023-06-12 DIAGNOSIS — Z0000 Encounter for general adult medical examination without abnormal findings: Secondary | ICD-10-CM | POA: Insufficient documentation

## 2023-06-12 DIAGNOSIS — R7301 Impaired fasting glucose: Secondary | ICD-10-CM | POA: Diagnosis not present

## 2023-06-12 DIAGNOSIS — Z23 Encounter for immunization: Secondary | ICD-10-CM | POA: Diagnosis not present

## 2023-06-12 DIAGNOSIS — K219 Gastro-esophageal reflux disease without esophagitis: Secondary | ICD-10-CM | POA: Insufficient documentation

## 2023-06-12 DIAGNOSIS — M109 Gout, unspecified: Secondary | ICD-10-CM

## 2023-06-12 LAB — COMPREHENSIVE METABOLIC PANEL
ALT: 28 U/L (ref 0–53)
AST: 23 U/L (ref 0–37)
Albumin: 4.1 g/dL (ref 3.5–5.2)
Alkaline Phosphatase: 80 U/L (ref 39–117)
BUN: 15 mg/dL (ref 6–23)
CO2: 24 meq/L (ref 19–32)
Calcium: 9.8 mg/dL (ref 8.4–10.5)
Chloride: 106 meq/L (ref 96–112)
Creatinine, Ser: 1.2 mg/dL (ref 0.40–1.50)
GFR: 64.18 mL/min (ref >60.00)
Glucose, Bld: 107 mg/dL — ABNORMAL HIGH (ref 70–99)
Potassium: 4.2 meq/L (ref 3.5–5.1)
Sodium: 138 meq/L (ref 135–145)
Total Bilirubin: 1.5 mg/dL — ABNORMAL HIGH (ref 0.2–1.2)
Total Protein: 6.8 g/dL (ref 6.0–8.3)

## 2023-06-12 LAB — LIPID PANEL
Cholesterol: 151 mg/dL (ref 0–200)
HDL: 43.3 mg/dL (ref >39.00)
LDL Cholesterol: 84 mg/dL (ref 0–99)
NonHDL: 107.23
Total CHOL/HDL Ratio: 3
Triglycerides: 114 mg/dL (ref 0.0–149.0)
VLDL: 22.8 mg/dL (ref 0.0–40.0)

## 2023-06-12 LAB — HEMOGLOBIN A1C: Hgb A1c MFr Bld: 5.6 % (ref 4.6–6.5)

## 2023-06-12 LAB — PSA: PSA: 0.5 ng/mL (ref 0.10–4.00)

## 2023-06-12 MED ORDER — COLCHICINE 0.6 MG PO TABS: 0.6000 mg | ORAL_TABLET | Freq: Every day | ORAL | 1 refills | Status: DC | PRN

## 2023-06-12 MED ORDER — LOSARTAN POTASSIUM 50 MG PO TABS: ORAL_TABLET | ORAL | 3 refills | Status: DC

## 2023-06-12 MED ORDER — OMEPRAZOLE 40 MG PO CPDR: 40.0000 mg | DELAYED_RELEASE_CAPSULE | Freq: Every day | ORAL | 3 refills | Status: AC

## 2023-06-12 NOTE — Patient Instructions (Addendum)
A few things to remember from today's visit:  Routine general medical examination at a health care facility  Hypertension, essential, benign - Plan: Comprehensive metabolic panel, losartan (COZAAR) 50 MG tablet  IFG (impaired fasting glucose) - Plan: Hemoglobin A1c  Hyperlipidemia, unspecified hyperlipidemia type - Plan: Comprehensive metabolic panel, Lipid panel  Prostate cancer screening - Plan: PSA  Need for Tdap vaccination - Plan: Tdap vaccine greater than or equal to 7yo IM  Gouty arthritis of toe of left foot - Plan: colchicine 0.6 MG tablet  No changes today. Let me know if you need a referral for colonoscopy in 10/2023.  If you need refills for medications you take chronically, please call your pharmacy. Do not use My Chart to request refills or for acute issues that need immediate attention. If you send a my chart message, it may take a few days to be addressed, specially if I am not in the office.  Please be sure medication list is accurate. If a new problem present, please set up appointment sooner than planned today.  Health Maintenance, Male Adopting a healthy lifestyle and getting preventive care are important in promoting health and wellness. Ask your health care provider about: The right schedule for you to have regular tests and exams. Things you can do on your own to prevent diseases and keep yourself healthy. What should I know about diet, weight, and exercise? Eat a healthy diet  Eat a diet that includes plenty of vegetables, fruits, low-fat dairy products, and lean protein. Do not eat a lot of foods that are high in solid fats, added sugars, or sodium. Maintain a healthy weight Body mass index (BMI) is a measurement that can be used to identify possible weight problems. It estimates body fat based on height and weight. Your health care provider can help determine your BMI and help you achieve or maintain a healthy weight. Get regular exercise Get regular  exercise. This is one of the most important things you can do for your health. Most adults should: Exercise for at least 150 minutes each week. The exercise should increase your heart rate and make you sweat (moderate-intensity exercise). Do strengthening exercises at least twice a week. This is in addition to the moderate-intensity exercise. Spend less time sitting. Even light physical activity can be beneficial. Watch cholesterol and blood lipids Have your blood tested for lipids and cholesterol at 64 years of age, then have this test every 5 years. You may need to have your cholesterol levels checked more often if: Your lipid or cholesterol levels are high. You are older than 64 years of age. You are at high risk for heart disease. What should I know about cancer screening? Many types of cancers can be detected early and may often be prevented. Depending on your health history and family history, you may need to have cancer screening at various ages. This may include screening for: Colorectal cancer. Prostate cancer. Skin cancer. Lung cancer. What should I know about heart disease, diabetes, and high blood pressure? Blood pressure and heart disease High blood pressure causes heart disease and increases the risk of stroke. This is more likely to develop in people who have high blood pressure readings or are overweight. Talk with your health care provider about your target blood pressure readings. Have your blood pressure checked: Every 3-5 years if you are 49-68 years of age. Every year if you are 85 years old or older. If you are between the ages of 21 and 30  and are a current or former smoker, ask your health care provider if you should have a one-time screening for abdominal aortic aneurysm (AAA). Diabetes Have regular diabetes screenings. This checks your fasting blood sugar level. Have the screening done: Once every three years after age 63 if you are at a normal weight and have a  low risk for diabetes. More often and at a younger age if you are overweight or have a high risk for diabetes. What should I know about preventing infection? Hepatitis B If you have a higher risk for hepatitis B, you should be screened for this virus. Talk with your health care provider to find out if you are at risk for hepatitis B infection. Hepatitis C Blood testing is recommended for: Everyone born from [birth year] through 1967. Anyone with known risk factors for hepatitis C. Sexually transmitted infections (STIs) You should be screened each year for STIs, including gonorrhea and chlamydia, if: You are sexually active and are younger than 65 years of age. You are older than 64 years of age and your health care provider tells you that you are at risk for this type of infection. Your sexual activity has changed since you were last screened, and you are at increased risk for chlamydia or gonorrhea. Ask your health care provider if you are at risk. Ask your health care provider about whether you are at high risk for HIV. Your health care provider may recommend a prescription medicine to help prevent HIV infection. If you choose to take medicine to prevent HIV, you should first get tested for HIV. You should then be tested every 3 months for as long as you are taking the medicine. Follow these instructions at home: Alcohol use Do not drink alcohol if your health care provider tells you not to drink. If you drink alcohol: Limit how much you have to 0-2 drinks a day. Know how much alcohol is in your drink. In the [place] one drink equals one 12 oz bottle of beer (355 mL), one 5 oz glass of wine (148 mL), or one 1 oz glass of hard liquor (44 mL). Lifestyle Do not use any products that contain nicotine or tobacco. These products include cigarettes, chewing tobacco, and vaping devices, such as e-cigarettes. If you need help quitting, ask your health care provider. Do not use street drugs. Do not share  needles. Ask your health care provider for help if you need support or information about quitting drugs. General instructions Schedule regular health, dental, and eye exams. Stay current with your vaccines. Tell your health care provider if: You often feel depressed. You have ever been abused or do not feel safe at home. Summary Adopting a healthy lifestyle and getting preventive care are important in promoting health and wellness. Follow your health care provider's instructions about healthy diet, exercising, and getting tested or screened for diseases. Follow your health care provider's instructions on monitoring your cholesterol and blood pressure. This information is not intended to replace advice given to you by your health care provider. Make sure you discuss any questions you have with your health care provider. Document Revised: 01/16/2021 Document Reviewed: 01/16/2021 Elsevier Patient Education  2024 [employer].

## 2023-06-12 NOTE — Assessment & Plan Note (Signed)
Continue Atorvastatin 20 mg daily and low fat diet. Further recommendations according to FLP result. 

## 2023-06-12 NOTE — Assessment & Plan Note (Signed)
Cough has improved with Omeprazole, which he is now taking prn. No changes in current management. Continue GERD precautions.

## 2023-06-12 NOTE — Assessment & Plan Note (Signed)
Overall BP adequately controlled, reporting similar numbers at home. Continue losartan 50 mg daily and low-salt diet. Continue monitoring BP at home, if he starts having frequent BP's >130/80, instructed to let me know , so we could increase losartan dose. Eye exam is current. As far as blood pressure is stable, annual follow-up is appropriate.

## 2023-06-12 NOTE — Assessment & Plan Note (Signed)
Having a few flare ups through the year. Continue colchicine 0.6 mg daily as needed, which has been effective. He is not interested in daily pharmacologic treatment like allopurinol. Continue low purine diet.

## 2023-06-12 NOTE — Assessment & Plan Note (Signed)
We discussed the importance of regular physical activity and healthy diet for prevention of chronic illness and/or complications. Preventive guidelines reviewed. Vaccination updated. Due for colonoscopy in 10/2023, instructed to let me know if he does not receive a letter from his GI. Next CPE in a year.

## 2023-06-12 NOTE — Assessment & Plan Note (Signed)
HgA1C was 5.8 in 05/2022. Encouraged to continue a healthy lifestyle for diabetes prevention. Further recommendation will be given according to A1c and glucose results.

## 2023-07-12 DIAGNOSIS — Z7409 Other reduced mobility: Secondary | ICD-10-CM | POA: Insufficient documentation

## 2023-07-22 ENCOUNTER — Other Ambulatory Visit: Payer: Self-pay | Admitting: Family Medicine

## 2023-07-22 DIAGNOSIS — E785 Hyperlipidemia, unspecified: Secondary | ICD-10-CM

## 2023-08-27 ENCOUNTER — Encounter: Payer: Self-pay | Admitting: Family Medicine

## 2023-08-27 ENCOUNTER — Ambulatory Visit: Payer: 59 | Admitting: Family Medicine

## 2023-08-27 VITALS — BP 128/80 | HR 88 | Temp 98.5°F | Resp 12 | Ht 73.0 in | Wt 218.0 lb

## 2023-08-27 DIAGNOSIS — M109 Gout, unspecified: Secondary | ICD-10-CM

## 2023-08-27 DIAGNOSIS — R059 Cough, unspecified: Secondary | ICD-10-CM

## 2023-08-27 MED ORDER — PREDNISONE 20 MG PO TABS
40.0000 mg | ORAL_TABLET | Freq: Every day | ORAL | 0 refills | Status: AC
Start: 2023-08-27 — End: 2023-09-01

## 2023-08-27 MED ORDER — ALBUTEROL SULFATE HFA 108 (90 BASE) MCG/ACT IN AERS
2.0000 | INHALATION_SPRAY | Freq: Four times a day (QID) | RESPIRATORY_TRACT | 0 refills | Status: AC | PRN
Start: 2023-08-27 — End: ?

## 2023-08-27 MED ORDER — ALLOPURINOL 100 MG PO TABS
100.0000 mg | ORAL_TABLET | Freq: Every day | ORAL | 1 refills | Status: DC
Start: 2023-08-27 — End: 2024-01-24

## 2023-08-27 NOTE — Progress Notes (Signed)
ACUTE VISIT Chief Complaint  Patient presents with   Gout    Left big toe in the joint    Cough    Ongoing, productive    HPI: Joseph Wheeler is a 62 y.o. male with a PMHx significant for HTN, HLD, gout, and impaired fasting glucose, who is here today complaining of a gout flare up and cough as described above.   Patient complains of sudden onset of pain , thought to be a gout flare up affecting his left great toe for 3 weeks. This is his second flare up this year.  He endorses swelling, redness, and pain in the toe. He rates the pain as a 4/10.   He took Colchicine 0.6 mg daily but it did not help to resolve problem.  He had been following with orthopedics since late October for achilles tendinitis, and brought this up. Orthopedist agreed that it was likely gout, and advised him to stop the Colchicine and take more regular Celebrex and use Voltaren gel. This has not helped with the problem either.  He also had x-rays of his foot on 08/14/23: 1.  No acute fracture or dislocation.  2.  Mild midfoot degenerative changes.  3.  Mild first MTP joint degenerative changes with bony spurring.  4.  Soft tissue swelling and mineralization around the medial aspect of first MTP joint which could be seen with tophus/gout.. Subjacent erosion of the medial first metatarsal head.  5.  Calcaneal enthesophytes.  6.  Vascular calcification.   Denies any fever, chills, or injury. Negative for fever,chills,changes in appetite. No dietary changes.  Lab Results  Component Value Date   LABURIC 8.5 (H) 05/28/2022   Patient also complains of a cough for the last 2 weeks.  He mentions he had some wheezing last week, which stopped on Friday, and endorses postnasal drainage.  He says the cough is worse when he is in bed on his back, and he has had to sleep on his side since it started.  No hx of childhood asthma.   Pertinent negatives include rhinorrhea, congestion, SOB, chest pain, or acid  reflux.   Review of Systems  Constitutional:  Positive for activity change. Negative for appetite change.  HENT:  Negative for sore throat.   Cardiovascular:  Negative for chest pain and palpitations.  Gastrointestinal:  Negative for abdominal pain, nausea and vomiting.  Genitourinary:  Negative for decreased urine volume, dysuria and hematuria.  Skin:  Negative for rash.  Neurological:  Negative for weakness, numbness and headaches.  See other pertinent positives and negatives in HPI.  Current Outpatient Medications on File Prior to Visit  Medication Sig Dispense Refill   atorvastatin (LIPITOR) 20 MG tablet TAKE 1 TABLET BY MOUTH WITH SUPPER 90 tablet 3   colchicine 0.6 MG tablet Take 1 tablet (0.6 mg total) by mouth daily as needed. 90 tablet 1   cyclobenzaprine (FLEXERIL) 10 MG tablet Take 0.5-1 tablets (5-10 mg total) by mouth 3 (three) times daily as needed. 30 tablet 0   fluticasone (FLONASE) 50 MCG/ACT nasal spray Place 1 spray into both nostrils 2 (two) times daily. 16 g 0   losartan (COZAAR) 50 MG tablet TAKE 1 TABLET BY MOUTH EVERY DAY 90 tablet 3   omeprazole (PRILOSEC) 40 MG capsule Take 1 capsule (40 mg total) by mouth daily. 90 capsule 3   No current facility-administered medications on file prior to visit.    Past Medical History:  Diagnosis Date   Arthritis  Gout    Hyperlipidemia    Hypertension    Allergies  Allergen Reactions   Penicillins     Social History   Socioeconomic History   Marital status: Married    Spouse name: Not on file   Number of children: Not on file   Years of education: Not on file   Highest education level: Doctorate  Occupational History   Not on file  Tobacco Use   Smoking status: Never   Smokeless tobacco: Never  Substance and Sexual Activity   Alcohol use: No    Alcohol/week: 0.0 standard drinks of alcohol   Drug use: No   Sexual activity: Not on file  Other Topics Concern   Not on file  Social History Narrative    Not on file   Social Drivers of Health   Financial Resource Strain: Low Risk  (08/26/2023)   Overall Financial Resource Strain (CARDIA)    Difficulty of Paying Living Expenses: Not hard at all  Food Insecurity: No Food Insecurity (08/26/2023)   Hunger Vital Sign    Worried About Running Out of Food in the Last Year: Never true    Ran Out of Food in the Last Year: Never true  Transportation Needs: No Transportation Needs (08/26/2023)   PRAPARE - Administrator, Civil Service (Medical): No    Lack of Transportation (Non-Medical): No  Physical Activity: Sufficiently Active (08/26/2023)   Exercise Vital Sign    Days of Exercise per Week: 5 days    Minutes of Exercise per Session: 30 min  Stress: No Stress Concern Present (08/26/2023)   [employer] of Occupational Health - Occupational Stress Questionnaire    Feeling of Stress : Only a little  Social Connections: Moderately Isolated (08/26/2023)   Social Connection and Isolation Panel [NHANES]    Frequency of Communication with Friends and Family: Once a week    Frequency of Social Gatherings with Friends and Family: Once a week    Attends Religious Services: 1 to 4 times per year    Active Member of [employer] or Organizations: No    Attends Banker Meetings: Not on file    Marital Status: Married    Vitals:   08/27/23 1318  BP: 128/80  Pulse: 88  Resp: 12  Temp: 98.5 F (36.9 C)  SpO2: 98%   Body mass index is 28.76 kg/m.  Physical Exam Vitals and nursing note reviewed.  Constitutional:      General: He is not in acute distress.    Appearance: He is well-developed.  HENT:     Head: Normocephalic and atraumatic.     Nose:     Right Turbinates: Enlarged.     Left Turbinates: Enlarged.     Mouth/Throat:     Pharynx: Uvula midline. Postnasal drip present.  Eyes:     Conjunctiva/sclera: Conjunctivae normal.  Cardiovascular:     Rate and Rhythm: Normal rate and regular rhythm.     Pulses:           Dorsalis pedis pulses are 2+ on the left side.     Heart sounds: No murmur heard. Pulmonary:     Effort: Pulmonary effort is normal. No respiratory distress.     Breath sounds: Normal breath sounds.  Musculoskeletal:     Left foot: Decreased range of motion. Normal capillary refill. Swelling and tenderness present.     Comments: Left first MTP joint with edema, local heat,and erythema. Limitation of flexion. Tenderness with palpation.  Feet:     Left foot:     Skin integrity: Erythema and warmth present.  Skin:    General: Skin is warm.     Findings: No erythema or rash.  Neurological:     General: No focal deficit present.     Mental Status: He is alert and oriented to person, place, and time.     Gait: Gait normal.  Psychiatric:        Mood and Affect: Mood and affect normal.   ASSESSMENT AND PLAN:  Joseph Wheeler was seen today for a gout flare up.   Gouty arthritis of toe of left foot Acute exacerbation, this is the second episode this year. Problem can also be arranged by OA. Prednisone to be taken with breakfast. Celebrex 100 mg bid for 7-10 days. Continue low purine diet. Allopurinol 100 mg daily started today.  -     predniSONE; Take 2 tablets (40 mg total) by mouth daily with breakfast for 5 days.  Dispense: 10 tablet; Refill: 0 -     Allopurinol; Take 1 tablet (100 mg total) by mouth daily.  Dispense: 90 tablet; Refill: 1  Cough, unspecified type  For 2 week, associated with wheezing for a few days, the latter has resolved. ? Post viral. Lung auscultation negative. He has had persistent cough in the past, resolved after taking Omeprazole. GERD can be a contributing factor. We decided to hold on imaging. Instructed about warning signs.  -     Albuterol Sulfate HFA; Inhale 2 puffs into the lungs every 6 (six) hours as needed for wheezing or shortness of breath.  Dispense: 8 g; Refill: 0  Return if symptoms worsen or fail to improve, for keep next  appointment.  I, Joseph Wheeler, acting as a scribe for Joseph Reyburn [place], MD., have documented all relevant documentation on the behalf of Joseph Swavely [place], MD, as directed by  Joseph Focht [place], MD while in the presence of Joseph Ritchey [place], MD.   I, Joseph Hollett [place], MD, have reviewed all documentation for this visit. The documentation on 08/27/23 for the exam, diagnosis, procedures, and orders are all accurate and complete.  Joseph Humes G. [place], MD  [facility]. [place] [facility].

## 2023-08-27 NOTE — Patient Instructions (Signed)
A few things to remember from today's visit:  Wheezing - Plan: albuterol (VENTOLIN HFA) 108 (90 Base) MCG/ACT inhaler  Gouty arthritis of toe of left foot - Plan: predniSONE (DELTASONE) 20 MG tablet, allopurinol (ZYLOPRIM) 100 MG tablet Take the prednisone with breakfast.Do not take at the same time with Celebrex. Allopurinol 100 mg daily. Please let me know in about 3 months if you still have gout attacks. Albuterol inh 2 puff every 6 hours for a week then as needed for wheezing or shortness of breath.   If you need refills for medications you take chronically, please call your pharmacy. Do not use My Chart to request refills or for acute issues that need immediate attention. If you send a my chart message, it may take a few days to be addressed, specially if I am not in the office.  Please be sure medication list is accurate. If a new problem present, please set up appointment sooner than planned today.

## 2023-09-05 ENCOUNTER — Encounter: Payer: Self-pay | Admitting: Family Medicine

## 2023-09-09 ENCOUNTER — Ambulatory Visit: Payer: 59 | Admitting: Family Medicine

## 2023-09-09 ENCOUNTER — Encounter: Payer: Self-pay | Admitting: Family Medicine

## 2023-09-09 VITALS — BP 145/95 | HR 75 | Temp 98.6°F | Resp 16 | Ht 73.0 in | Wt 216.2 lb

## 2023-09-09 DIAGNOSIS — M109 Gout, unspecified: Secondary | ICD-10-CM | POA: Diagnosis not present

## 2023-09-09 DIAGNOSIS — M19079 Primary osteoarthritis, unspecified ankle and foot: Secondary | ICD-10-CM | POA: Diagnosis not present

## 2023-09-09 DIAGNOSIS — I10 Essential (primary) hypertension: Secondary | ICD-10-CM | POA: Diagnosis not present

## 2023-09-09 MED ORDER — CELECOXIB 100 MG PO CAPS
100.0000 mg | ORAL_CAPSULE | Freq: Every day | ORAL | 1 refills | Status: DC | PRN
Start: 2023-09-09 — End: 2023-12-19

## 2023-09-09 NOTE — Patient Instructions (Signed)
A few things to remember from today's visit:  Arthritis of first MTP joint - Plan: celecoxib (CELEBREX) 100 MG capsule  Hypertension, essential, benign  Monitor blood pressure at home and let me know about readings in 2 weeks. Celebrex 100 mg daily as needed for pain. Keep appt with ortho. No change in gout meds.  If you need refills for medications you take chronically, please call your pharmacy. Do not use My Chart to request refills or for acute issues that need immediate attention. If you send a my chart message, it may take a few days to be addressed, specially if I am not in the office.  Please be sure medication list is accurate. If a new problem present, please set up appointment sooner than planned today.

## 2023-09-09 NOTE — Progress Notes (Signed)
ACUTE VISIT Chief Complaint  Patient presents with   Toe Pain    Patient complains of recurrent left great toe pain x4 weeks, same since the last visit   HPI: JosephJoseph Wheeler is a 62 y.o. male with a PMHx significant for HTN, HLD, gout, and impaired fasting glucose, who is here today complaining of continued swelling and pain in his left great toe since his visit on 12/17.   Patient states pain has not changed significantly since his last visit, when he was treated for acute gout attack with Celebrex and prednisone. Pain has been going on for 4 weeks, has seen ortho. He rates the pain as a 3-4/10 most of the time, but it gets as bad as 8/10.  Currently on allopurinol 100 mg daily. Started last visit.  When he was on Celebrex, it helped significantly. He has also used KT tape, which helps his pain.  The swelling is slightly down, but still present.   He had an X-ray of the left foot on 08/14/2023 ordered bu ortho with the following impression: 1. No acute fracture or dislocation. 2. Mild midfoot degenerative changes. 3. Mild first MTP joint degenerative changes with bony spurring. 4. Soft tissue swelling and mineralization around the medial aspect of first MTP joint which could be seen with tophus/gout.. Subjacent erosion of the medial first metatarsal head. 5. Calcaneal enthesophytes. 6. Vascular calcification.   He has an appointment with orthopedic PA on 09/16/2023.   Hypertension: BP elevated today. Medications: Currently on losartan 50 mg daily.  His BP in the office today was 140/90. Upon recheck, it is 145/95.  Negative for visual changes, exertional chest pain, dyspnea,  focal weakness, or edema.  Lab Results  Component Value Date   CREATININE 1.20 06/12/2023   BUN 15 06/12/2023   NA 138 06/12/2023   K 4.2 06/12/2023   CL 106 06/12/2023   CO2 24 06/12/2023   Review of Systems  Constitutional:  Negative for appetite change, chills and fever.  Respiratory:   Negative for cough.   Gastrointestinal:  Negative for abdominal pain, nausea and vomiting.  Genitourinary:  Negative for decreased urine volume, dysuria and hematuria.  Musculoskeletal:  Positive for arthralgias.  Skin:  Negative for rash and wound.  Neurological:  Negative for syncope and headaches.  See other pertinent positives and negatives in HPI.  Current Outpatient Medications on File Prior to Visit  Medication Sig Dispense Refill   albuterol (VENTOLIN HFA) 108 (90 Base) MCG/ACT inhaler Inhale 2 puffs into the lungs every 6 (six) hours as needed for wheezing or shortness of breath. 8 g 0   allopurinol (ZYLOPRIM) 100 MG tablet Take 1 tablet (100 mg total) by mouth daily. 90 tablet 1   atorvastatin (LIPITOR) 20 MG tablet TAKE 1 TABLET BY MOUTH WITH SUPPER 90 tablet 3   cyclobenzaprine (FLEXERIL) 10 MG tablet Take 0.5-1 tablets (5-10 mg total) by mouth 3 (three) times daily as needed. 30 tablet 0   fluticasone (FLONASE) 50 MCG/ACT nasal spray Place 1 spray into both nostrils 2 (two) times daily. 16 g 0   losartan (COZAAR) 50 MG tablet TAKE 1 TABLET BY MOUTH EVERY DAY 90 tablet 3   omeprazole (PRILOSEC) 40 MG capsule Take 1 capsule (40 mg total) by mouth daily. 90 capsule 3   No current facility-administered medications on file prior to visit.    Past Medical History:  Diagnosis Date   Arthritis    Gout    Hyperlipidemia  Hypertension    Allergies  Allergen Reactions   Penicillins     Social History   Socioeconomic History   Marital status: Married    Spouse name: Not on file   Number of children: Not on file   Years of education: Not on file   Highest education level: Doctorate  Occupational History   Not on file  Tobacco Use   Smoking status: Never   Smokeless tobacco: Never  Substance and Sexual Activity   Alcohol use: No    Alcohol/week: 0.0 standard drinks of alcohol   Drug use: No   Sexual activity: Not on file  Other Topics Concern   Not on file   Social History Narrative   Not on file   Social Drivers of Health   Financial Resource Strain: Low Risk  (08/26/2023)   Overall Financial Resource Strain (CARDIA)    Difficulty of Paying Living Expenses: Not hard at all  Food Insecurity: No Food Insecurity (08/26/2023)   Hunger Vital Sign    Worried About Running Out of Food in the Last Year: Never true    Ran Out of Food in the Last Year: Never true  Transportation Needs: No Transportation Needs (08/26/2023)   PRAPARE - Administrator, Civil Service (Medical): No    Lack of Transportation (Non-Medical): No  Physical Activity: Sufficiently Active (08/26/2023)   Exercise Vital Sign    Days of Exercise per Week: 5 days    Minutes of Exercise per Session: 30 min  Stress: No Stress Concern Present (08/26/2023)   [employer] of Occupational Health - Occupational Stress Questionnaire    Feeling of Stress : Only a little  Social Connections: Moderately Isolated (08/26/2023)   Social Connection and Isolation Panel [NHANES]    Frequency of Communication with Friends and Family: Once a week    Frequency of Social Gatherings with Friends and Family: Once a week    Attends Religious Services: 1 to 4 times per year    Active Member of [employer] or Organizations: No    Attends Banker Meetings: Not on file    Marital Status: Married    Vitals:   09/09/23 1138 09/09/23 1201  BP: (!) 140/90 (!) 145/95  Pulse:    Resp:    Temp:    SpO2:     Body mass index is 28.52 kg/m.  Physical Exam Vitals and nursing note reviewed.  Constitutional:      General: He is not in acute distress.    Appearance: He is well-developed.  HENT:     Head: Normocephalic and atraumatic.  Eyes:     Conjunctiva/sclera: Conjunctivae normal.  Cardiovascular:     Rate and Rhythm: Normal rate and regular rhythm.     Pulses:          Dorsalis pedis pulses are 2+ on the left side.       Posterior tibial pulses are 2+ on the left  side.     Heart sounds: No murmur heard. Pulmonary:     Effort: Pulmonary effort is normal. No respiratory distress.     Breath sounds: Normal breath sounds.  Abdominal:     Palpations: Abdomen is soft. There is no hepatomegaly or mass.     Tenderness: There is no abdominal tenderness.  Musculoskeletal:     Left foot: Decreased range of motion. Tenderness (1st MTP joint.) present.  Feet:     Comments: Left foot: 1st MTP joint with mild edema, no erythema  or local heat. Limitation of ROM, pain elicited with dorsiflexion. Skin peeling on left bunion. NO vesicular lesions or rash. Skin:    General: Skin is warm.     Findings: No erythema or rash.  Neurological:     Mental Status: He is alert and oriented to person, place, and time.     Cranial Nerves: No cranial nerve deficit.     Gait: Gait normal.  Psychiatric:        Mood and Affect: Mood and affect normal.    ASSESSMENT AND PLAN:  Mr. Sanangelo was seen today for pain and swelling of the left great toe.   Arthritis of first MTP joint We discussed differential Dx for pain in this joint, OA,bunion,and gout. Acute gout flare up has resolved.  I do not think we need to repeat treatment with prednisone or change Allopurinol dose. Pain greatly improved with Celebrex, he would like to have medication in case he needs it for pain.Side effects discussed. BP mildly elevated today, recommend not to take med if BP is not under 140/90. He can try first Voltaren gel. Shoe inserts and devises the help to reposition toe also help with pain. Has appt with ortho in a few days. Consider consultation with podiatrist.  -     Celecoxib; Take 1 capsule (100 mg total) by mouth daily as needed.  Dispense: 30 capsule; Refill: 1  Gouty arthritis of toe of left foot Assessment & Plan: Acute episode has resolved, some residual edema. LE elevation may help. Continue Allopurinol 100 mg daily, Colchicine 0.6 mg daily prn,and low purine  diet.   Hypertension, essential, benign Assessment & Plan: BP mildly elevated today, reporting better BP's at home but not checking it frequently. Continue Losartan 50 mg daily. BP check daily and to let me know about readings in 2 weeks. We discussed some side effects of NSAID's.  Return if symptoms worsen or fail to improve, for keep next appointment.  I, Rolla Etienne Wierda, acting as a scribe for Ramesses Crampton [place], MD., have documented all relevant documentation on the behalf of Jasyah Theurer [place], MD, as directed by  Mikeyla Music [place], MD while in the presence of Kunta Hilleary [place], MD.   I, Swan Zayed [place], MD, have reviewed all documentation for this visit. The documentation on 09/09/23 for the exam, diagnosis, procedures, and orders are all accurate and complete.  Kimyah Frein G. [place], MD  [facility]. [place] [facility].

## 2023-09-09 NOTE — Assessment & Plan Note (Signed)
Acute episode has resolved, some residual edema. LE elevation may help. Continue Allopurinol 100 mg daily, Colchicine 0.6 mg daily prn,and low purine diet.

## 2023-09-09 NOTE — Assessment & Plan Note (Addendum)
BP mildly elevated today, reporting better BP's at home but not checking it frequently. Continue Losartan 50 mg daily. BP check daily and to let me know about readings in 2 weeks. We discussed some side effects of NSAID's.

## 2023-09-18 ENCOUNTER — Encounter: Payer: Self-pay | Admitting: Family Medicine

## 2023-10-22 ENCOUNTER — Other Ambulatory Visit: Payer: Self-pay | Admitting: Family Medicine

## 2023-10-22 DIAGNOSIS — I10 Essential (primary) hypertension: Secondary | ICD-10-CM

## 2023-10-22 MED ORDER — LOSARTAN POTASSIUM 100 MG PO TABS
100.0000 mg | ORAL_TABLET | Freq: Every day | ORAL | 1 refills | Status: DC
Start: 2023-10-22 — End: 2023-12-19

## 2023-12-09 ENCOUNTER — Encounter: Payer: Self-pay | Admitting: Family Medicine

## 2023-12-10 ENCOUNTER — Ambulatory Visit: Payer: Self-pay

## 2023-12-10 ENCOUNTER — Other Ambulatory Visit: Payer: Self-pay | Admitting: Family Medicine

## 2023-12-10 DIAGNOSIS — I10 Essential (primary) hypertension: Secondary | ICD-10-CM

## 2023-12-10 MED ORDER — AMLODIPINE BESYLATE 5 MG PO TABS
5.0000 mg | ORAL_TABLET | Freq: Every day | ORAL | 0 refills | Status: DC
Start: 2023-12-10 — End: 2023-12-19

## 2023-12-10 NOTE — Telephone Encounter (Signed)
 Copied from CRM [id number]. Topic: Clinical - Red Word Triage >> Dec 10, 2023  8:38 AM Truddie Crumble wrote: Reason for CRM: patient called stating he has taken to much of losartan and he is running out and the provider also amlodipine to go with it. Patient has taken a double dose of losartan for the last month. Patient is not having any symptoms. He stated it does not feel right but it is not affecting him in bad way. Patient stated his blood pressure is still high

## 2023-12-10 NOTE — Telephone Encounter (Signed)
PCP responded via mychart

## 2023-12-10 NOTE — Telephone Encounter (Signed)
  Chief Complaint: took double dose Losartan  Symptoms: none  Frequency: x 1 month Pertinent Negatives: Patient denies dizziness, Low BP  Disposition: [] ED /[] Urgent Care (no appt availability in office) / [] Appointment(In office/virtual)/ []  [facility]/ [] Home Care/ [] Refused Recommended Disposition /[] [facility]/ [x]  Follow-up with PCP Additional Notes: since pt has been taking double dose - will need refill Copied from CRM [id number]. Topic: Clinical - Red Word Triage >> Dec 10, 2023  8:38 AM Truddie Crumble wrote: Reason for CRM: patient called stating he has taken to much of losartan and he is running out and the provider also amlodipine to go with it. Patient has taken a double dose of losartan for the last month. Patient is not having any symptoms. He stated it does not feel right but it is not affecting him in bad way. Patient stated his blood pressure is still high Reason for Disposition  [1] DOUBLE DOSE (an extra dose or lesser amount) of prescription drug AND [2] NO symptoms  (Exception: A double dose of antibiotics.)  Answer Assessment - Initial Assessment Questions 1. SUBSTANCE: "What was swallowed?" If necessary, have the caller look at the product or drug label on the container to determine active ingredients.     Losartan  2. AMOUNT: "How much was swallowed?" (e.g., what was the possible maximum amount)      200 mg daily for a month 3. ONSET: "When was it probably swallowed?" (Minutes or hours ago)      1 month  4. SYMPTOMS: "Do you have any symptoms?" If Yes, ask: "What are they?" (e.g., abdomen pain, vomiting, weakness)      None  5. TREATMENT: "Have you done anything to treat this?" If Yes, ask: "What did you do?"     N/a 6. SUICIDAL: "Did you take this to hurt or kill yourself?"     N/a 7. PREGNANCY: "Is there any chance you are pregnant?" "When was your last menstrual period?"     N/a  Protocols used: Poisoning-A-AH

## 2023-12-10 NOTE — Telephone Encounter (Signed)
 Patient is awaiting a call from the office re: bp

## 2023-12-12 ENCOUNTER — Encounter: Payer: Self-pay | Admitting: Family Medicine

## 2023-12-19 ENCOUNTER — Telehealth: Admitting: Family Medicine

## 2023-12-19 ENCOUNTER — Encounter: Payer: Self-pay | Admitting: Family Medicine

## 2023-12-19 VITALS — BP 134/81 | HR 68 | Ht 73.0 in

## 2023-12-19 DIAGNOSIS — I10 Essential (primary) hypertension: Secondary | ICD-10-CM | POA: Diagnosis not present

## 2023-12-19 DIAGNOSIS — I4510 Unspecified right bundle-branch block: Secondary | ICD-10-CM | POA: Diagnosis not present

## 2023-12-19 DIAGNOSIS — M109 Gout, unspecified: Secondary | ICD-10-CM | POA: Diagnosis not present

## 2023-12-19 MED ORDER — AMLODIPINE-OLMESARTAN 5-40 MG PO TABS: 1.0000 | ORAL_TABLET | Freq: Every day | ORAL | 1 refills | Status: DC

## 2023-12-19 NOTE — Progress Notes (Signed)
 Virtual Visit via Video Note I connected with Joseph Wheeler on 12/19/2023 by a video enabled telemedicine application and verified that I am speaking with the correct person using two identifiers. Location patient: home/office Location provider:work office Persons participating in the virtual visit: patient, provider, scribe  I discussed the limitations of evaluation and management by telemedicine and the availability of in person appointments. The patient expressed understanding and agreed to proceed.  Chief Complaint  Patient presents with   Follow-up    ER visit    HPI: [name] is a 63 y.o. male patient with a PMHx significant for HTN, HLD, gout, and impaired fasting glucose who is being seen on video today for ED follow up.  He was seen in the ER on 12/11/2023. He says he was having an achy chest pain and feeling shaky and anxious. He took his blood pressure and the systolic reading was 190. He was recommended to go to the ED.  He had labs, an EKG, and a chest x-ray.  Troponin x 2 negative.  Contains abnormal data Comprehensive Metabolic Panel Order: [id number] Component Ref Range & Units 8 d ago  Sodium 136 - 145 mmol/L 138  Potassium 3.5 - 5.1 mmol/L 4.2  Comment: NO VISIBLE HEMOLYSIS  Chloride 98 - 107 mmol/L 105  CO2 21 - 31 mmol/L 25  Anion Gap 6 - 14 mmol/L 8  Glucose, Random 70 - 99 mg/dL 578 High   Blood Urea Nitrogen (BUN) 7 - 25 mg/dL 13  Creatinine 4.69 - 6.29 mg/dL 5.28  eGFR >41 LK/GMW/1.02V2 70  Comment: GFR estimated by CKD-EPI equations(NKF 2021).  "Recommend confirmation of Cr-based eGFR by using Cys-based eGFR and other filtration markers (if applicable) in complex cases and clinical decision-making, as needed."  Albumin 3.5 - 5.7 g/dL 4.2  Total Protein 6.4 - 8.9 g/dL 7.1  Bilirubin, Total 0.3 - 1.0 mg/dL 1.1 High   Alkaline Phosphatase (ALP) 34 - 104 U/L 85  Aspartate Aminotransferase (AST) 13 - 39 U/L 26  Alanine  Aminotransferase (ALT) 7 - 52 U/L 39  Calcium 8.6 - 10.3 mg/dL 53.6   CBC with Differential Order: [id number] Component Ref Range & Units 8 d ago  WBC 4.40 - 11.00 10*3/uL 4.7  RBC 4.50 - 5.90 10*6/uL 5.41  Hemoglobin 14.0 - 17.5 g/dL 59.5  Hematocrit 63.8 - 50.4 % 49.4  Mean Corpuscular Volume (MCV) 80.0 - 96.0 fL 91.2  Mean Corpuscular Hemoglobin (MCH) 27.5 - 33.2 pg 31.1  Mean Corpuscular Hemoglobin Conc (MCHC) 33.0 - 37.0 g/dL 75.6  Red Cell Distribution Width (RDW) 12.3 - 17.0 % 13.3  Platelet Count (PLT) 150 - 450 10*3/uL 199  Mean Platelet Volume (MPV) 6.8 - 10.2 fL 7.5  Neutrophils % % 66  Lymphocytes % % 19  Monocytes % % 11  Eosinophils % % 3  Basophils % % 1  nRBC % % 0  Neutrophils Absolute 1.80 - 7.80 10*3/uL 3.1  Lymphocytes # 1.00 - 4.80 10*3/uL 0.9 Low   Monocytes # 0.00 - 0.80 10*3/uL 0.5  Eosinophils # 0.00 - 0.50 10*3/uL 0.1   None of his medications were changed.  He is currently on amlodipine 5 mg daily and losartan 100 mg daily.   Since his last visit, he says his systolic BP readings have been 140s-150s in October and 140s in November. At his November orthopedics appointment it was 180. His diastolic readings over this time frame have been 85-95.  Today his BP was 134/81 at home.  He believes it has been coming slightly down since adding amlodipine on 4/1.   He has had some recent chest pain on the left side, which he believes feels like reflux. No associated palpitations, SOB,diaphoresis, or syncope. He doe s not have an y problem when exercising. EKG negative for acute ischemia, RBBB, no prior EKG available.    Denies  symptoms suggestive of of OSA. He is following a low salt diet.   No longer taking celebrex.   -He believes his gout is well controlled at this time.  Currently he is on allopurinol 100 mg daily, which he has tolerated well. He also takes colchicine 0.6 mg daily as needed.  ROS: See pertinent positives and  negatives per HPI.  Past Medical History:  Diagnosis Date   Arthritis    Gout    Hyperlipidemia    Hypertension    History reviewed. No pertinent surgical history.  Family History  Problem Relation Age of Onset   Arthritis Mother    Diabetes Mother    Cancer Father        prostate   Hypertension Father    Hyperlipidemia Father    Cancer Paternal Grandmother        breast.    Social History   Socioeconomic History   Marital status: Married    Spouse name: Not on file   Number of children: Not on file   Years of education: Not on file   Highest education level: Doctorate  Occupational History   Not on file  Tobacco Use   Smoking status: Never   Smokeless tobacco: Never  Substance and Sexual Activity   Alcohol use: No    Alcohol/week: 0.0 standard drinks of alcohol   Drug use: No   Sexual activity: Not on file  Other Topics Concern   Not on file  Social History Narrative   Not on file   Social Drivers of Health   Financial Resource Strain: Low Risk  (08/26/2023)   Overall Financial Resource Strain (CARDIA)    Difficulty of Paying Living Expenses: Not hard at all  Food Insecurity: Low Risk  (12/11/2023)   Received from [facility]   Hunger Vital Sign    Worried About Running Out of Food in the Last Year: Never true    Ran Out of Food in the Last Year: Never true  Transportation Needs: No Transportation Needs (12/11/2023)   Received from [facility]    In the past 12 months, has lack of reliable transportation kept you from medical appointments, meetings, work or from getting things needed for daily living? : No  Physical Activity: Sufficiently Active (08/26/2023)   Exercise Vital Sign    Days of Exercise per Week: 5 days    Minutes of Exercise per Session: 30 min  Stress: No Stress Concern Present (08/26/2023)   [employer] of Occupational Health - Occupational Stress Questionnaire    Feeling of Stress : Only a little  Social  Connections: Moderately Isolated (08/26/2023)   Social Connection and Isolation Panel [NHANES]    Frequency of Communication with Friends and Family: Once a week    Frequency of Social Gatherings with Friends and Family: Once a week    Attends Religious Services: 1 to 4 times per year    Active Member of [employer] or Organizations: No    Attends Banker Meetings: Not on file    Marital Status: Married  Intimate Partner Violence: Not At Risk (06/23/2022)  Received from [facility]   HITS    Over the last 12 months how often did your partner physically hurt you?: Never    Over the last 12 months how often did your partner insult you or talk down to you?: Never    Over the last 12 months how often did your partner threaten you with physical harm?: Never    Over the last 12 months how often did your partner scream or curse at you?: Rarely     Current Outpatient Medications:    albuterol (VENTOLIN HFA) 108 (90 Base) MCG/ACT inhaler, Inhale 2 puffs into the lungs every 6 (six) hours as needed for wheezing or shortness of breath., Disp: 8 g, Rfl: 0   allopurinol (ZYLOPRIM) 100 MG tablet, Take 1 tablet (100 mg total) by mouth daily., Disp: 90 tablet, Rfl: 1   amLODipine-olmesartan (AZOR) 5-40 MG tablet, Take 1 tablet by mouth daily., Disp: 30 tablet, Rfl: 1   atorvastatin (LIPITOR) 20 MG tablet, TAKE 1 TABLET BY MOUTH WITH SUPPER, Disp: 90 tablet, Rfl: 3   cyclobenzaprine (FLEXERIL) 10 MG tablet, Take 0.5-1 tablets (5-10 mg total) by mouth 3 (three) times daily as needed., Disp: 30 tablet, Rfl: 0   fluticasone (FLONASE) 50 MCG/ACT nasal spray, Place 1 spray into both nostrils 2 (two) times daily., Disp: 16 g, Rfl: 0   omeprazole (PRILOSEC) 40 MG capsule, Take 1 capsule (40 mg total) by mouth daily., Disp: 90 capsule, Rfl: 3  EXAM:BP 134/81   Pulse 68   Ht 6\' 1"  (1.854 m)   BMI 28.52 kg/m   VITALS per patient if applicable:  GENERAL: alert, oriented, appears well  and in no acute distress  HEENT: atraumatic, conjunctiva clear, no obvious abnormalities on inspection of external nose and ears  NECK: normal movements of the head and neck  LUNGS: on inspection no signs of respiratory distress, breathing rate appears normal, no obvious gross SOB, gasping or wheezing  CV: no obvious cyanosis  MS: moves all visible extremities without noticeable abnormality  PSYCH/NEURO: pleasant and cooperative, no obvious depression or anxiety, speech and thought processing grossly intact  ASSESSMENT AND PLAN:  Discussed the following assessment and plan:  Hypertension, essential, benign Assessment & Plan: BP has improved but is still mildly elevated. We discussed a few options, he agrees with changing to a combination pill, Amlodipine-olmesartan 5-40 mg daily.  He will discontinue amlodipine 5 mg and losartan 100 mg. Continue low-salt diet. Continue monitoring BP regularly, he will let me know about BP readings in 2 to 3 weeks. Instructed about warning signs. I will see him back in 06/2024 for his CPE, before if needed.  Orders: -     amLODIPine-Olmesartan; Take 1 tablet by mouth daily.  Dispense: 30 tablet; Refill: 1  RBBB Assessment & Plan: No prior EKGs available. Asymptomatic. Instructed about warning signs.   Gout, arthropathy Assessment & Plan: Problem has been better controlled since started allopurinol 100 mg daily, recommend to continue it. Continue low purine diet and colchicine 0.6 mg daily as needed.   We discussed possible serious and likely etiologies, options for evaluation and workup, limitations of telemedicine visit vs in person visit, treatment, treatment risks and precautions. The patient was advised to call back or seek an in-person evaluation if the symptoms worsen or if the condition fails to improve as anticipated. I discussed the assessment and treatment plan with the patient. The patient was provided an opportunity to ask  questions and all were answered.  The patient agreed with the plan and demonstrated an understanding of the instructions.  Return in about 6 months (around 06/19/2024) for CPE, Labs, chronic problems.  I, Rolla Etienne Wierda, acting as a scribe for Menno Vanbergen [place], MD., have documented all relevant documentation on the behalf of Jeno Calleros [place], MD, as directed by  Houston Zapien [place], MD while in the presence of Axiel Fjeld [place], MD.   I, Sharnette Kitamura [place], MD, have reviewed all documentation for this visit. The documentation on 12/19/23 for the exam, diagnosis, procedures, and orders are all accurate and complete.  Kortlynn Poust [place], MD

## 2023-12-19 NOTE — Assessment & Plan Note (Signed)
 Problem has been better controlled since started allopurinol 100 mg daily, recommend to continue it. Continue low purine diet and colchicine 0.6 mg daily as needed.

## 2023-12-19 NOTE — Assessment & Plan Note (Signed)
 BP has improved but is still mildly elevated. We discussed a few options, he agrees with changing to a combination pill, Amlodipine-olmesartan 5-40 mg daily.  He will discontinue amlodipine 5 mg and losartan 100 mg. Continue low-salt diet. Continue monitoring BP regularly, he will let me know about BP readings in 2 to 3 weeks. Instructed about warning signs. I will see him back in 06/2024 for his CPE, before if needed.

## 2023-12-19 NOTE — Assessment & Plan Note (Signed)
 No prior EKGs available. Asymptomatic. Instructed about warning signs.

## 2023-12-23 ENCOUNTER — Ambulatory Visit: Payer: Self-pay

## 2023-12-23 ENCOUNTER — Ambulatory Visit: Admitting: Family Medicine

## 2023-12-23 ENCOUNTER — Encounter: Payer: Self-pay | Admitting: Family Medicine

## 2023-12-23 VITALS — BP 136/82 | HR 88 | Ht 73.0 in | Wt 208.0 lb

## 2023-12-23 DIAGNOSIS — H532 Diplopia: Secondary | ICD-10-CM

## 2023-12-23 DIAGNOSIS — I10 Essential (primary) hypertension: Secondary | ICD-10-CM | POA: Diagnosis not present

## 2023-12-23 DIAGNOSIS — F4329 Adjustment disorder with other symptoms: Secondary | ICD-10-CM | POA: Diagnosis not present

## 2023-12-23 NOTE — Patient Instructions (Signed)
 Okay to stop the omelsartan/amlodipine tab  Please start your old losartan 100 mg daily in the morning.  Take amlodipine 2.5 mg in the evening.  Track blood pressures over the next 10 to 14 days.  If blood pressures are still greater then 135 on average then try increasing the amlodipine to 5 mg at night and monitor blood pressure for an additional 10 to 14 days and then notify Dr. [place].  Try to avoid any anti-inflammatories such as Aleve ibuprofen etc.  Avoid decongestants.   Please call your eye doctor that you saw in January and see if you can get an appointment for double vision.

## 2023-12-23 NOTE — Telephone Encounter (Signed)
 Chief Complaint: medication concerns Symptoms: double vision and dizziness Frequency: since friday Pertinent Negatives: Patient denies headache Disposition: [] ED /[] Urgent Care (no appt availability in office) / [x] Appointment(In office/virtual)/ []  [facility]/ [] Home Care/ [] Refused Recommended Disposition /[] [place] [facility]/ []  Follow-up with PCP Additional Notes: pt states that he was started and a new BP medication on Thursday and that Friday he noticed some double vision when looking down at things close up. States that he does not notice it when looking far away just up close. States that he noticed some slight dizziness as well. States BP's have been around in 130's/80's.    Copied from CRM [id number]. Topic: Clinical - Red Word Triage >> Dec 23, 2023  8:37 AM [employer] wrote: Red Word that prompted transfer to Nurse Triage: experiencing double vision, dizziness after taking amLODipine-olmesartan (AZOR) 5-40 MG tablet  BP Reading: 131/82 Reason for Disposition  [1] Prescription not at pharmacy AND [2] was prescribed by PCP recently (Exception: [facility] has access to EMR and prescription is recorded there. Go to Home Care and confirm for pharmacy.)  Answer Assessment - Initial Assessment Questions 1. NAME of MEDICINE: "What medicine(s) are you calling about?"     Amlodipine-olmesartan 2. QUESTION: "What is your question?" (e.g., double dose of medicine, side effect)     Side effect 3. PRESCRIBER: "Who prescribed the medicine?" Reason: if prescribed by specialist, call should be referred to that group.     [place] 4. SYMPTOMS: "Do you have any symptoms?" If Yes, ask: "What symptoms are you having?"  "How bad are the symptoms (e.g., mild, moderate, severe)     Double and blurred vision  Protocols used: Medication Question Call-A-AH

## 2023-12-23 NOTE — Assessment & Plan Note (Signed)
 Been under a lot of stress recently.  I offered to refer him for therapy/counseling.  His wife was here with him today and just encouraged him to think about it he is also not been sleeping as well and we discussed how stress can actually contribute to elevated blood pressure as well.  Him to reach out to his PCP if he decides that therapy/counseling would be helpful for him

## 2023-12-23 NOTE — Assessment & Plan Note (Signed)
 He would really prefer to go back on the losartan that he was taking for a while and then maybe restart a low-dose of the amlodipine at 2.5 mg he says he still has his old prescription at home.  That is perfectly fine to just take a step back and then start tracking those pressures.  We also had a long discussion today about how anxiety can actually affect blood pressure as well.  He says he has been trying to exercise regularly mostly doing stretches because of his Achilles tendon-itis he has not been doing his cardio like he used to.  Okay to stop the omelsartan/amlodipine tab  Please start your old losartan 100 mg daily in the morning.  Take amlodipine 2.5 mg in the evening.  Track blood pressures over the next 10 to 14 days.  If blood pressures are still greater then 135 on average then try increasing the amlodipine to 5 mg at night and monitor blood pressure for an additional 10 to 14 days and then notify Dr. [place].

## 2023-12-23 NOTE — Progress Notes (Signed)
 Established Patient Office Visit  Subjective  Patient ID: Joseph Wheeler, male    DOB: [birth year]  Age: 63 y.o. MRN: [id number]  Chief Complaint  Patient presents with   Medication Problem    HPI  He reports that last fall he went to the orthopedist for some issues with his feet including gout and arthritis and blood pressure was in the 180s.  When he went for his follow-up it was still elevated.  Ended up following up with his PCP and they started him on losartan 100 mg but he misunderstood and actually took 2 of the 100 mg for almost a month.  He did not feel great during that time he was just feeling a little bit anxious.  He followed back up for the pressures and went back down to 100 mg and 2.5 amlodipine was started.  The day after he started the amlodipine though he felt anxious jittery nauseated and actually ended up going to the emergency department.  He is still taking amlodipine and says those initial symptoms did go away but he is here today because he started experiencing some double vision after he went up to the 5 mg component of the amlodipine.  He reports that on Friday he started having some double vision when looking down and looking at things up close.  He states that he noticed some slight dizziness as well.no SOB or CP or tightness.  Still noticing a little bit of double vision when he looks down.  Sxs started after starting the Azor on Thur night.    He did go to the emergency department recently over at [facility] on April 2 for uncontrolled high blood pressure blood pressure was in the 160s there.  At time he was also having some chest discomfort.    Creatinine is usually around 1.1-1.2 with a GFR in the 60s.  He doesn't snore.    He also reports that he has been under a lot of stress.  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    ROS    Objective:     BP 136/82   Pulse 88   Ht 6\' 1"  (1.854 m)   Wt 208 lb (94.3 kg)   SpO2 98%   BMI 27.44 kg/m    Physical Exam Constitutional:      Appearance: Normal appearance.  HENT:     Head: Normocephalic and atraumatic.     Right Ear: Tympanic membrane, ear canal and external ear normal. There is no impacted cerumen.     Left Ear: Tympanic membrane, ear canal and external ear normal. There is no impacted cerumen.     Nose: Nose normal.     Mouth/Throat:     Pharynx: Oropharynx is clear.  Eyes:     Conjunctiva/sclera: Conjunctivae normal.  Cardiovascular:     Rate and Rhythm: Normal rate and regular rhythm.  Pulmonary:     Effort: Pulmonary effort is normal.     Breath sounds: Normal breath sounds.  Musculoskeletal:     Cervical back: Neck supple. No tenderness.  Lymphadenopathy:  Cervical: No cervical adenopathy.  Skin:    General: Skin is warm and dry.  Neurological:     Mental Status: He is alert and oriented to person, place, and time.     Comments: EOM intact.   Psychiatric:        Mood and Affect: Mood normal.      No results found for any visits on 12/23/23.    The 10-year ASCVD risk score (Arnett DK, et al., 2019) is: 13.5%    Assessment & Plan:   Problem List Items Addressed This Visit       Cardiovascular and Mediastinum   Hypertension, essential, benign - Primary (Chronic)   He would really prefer to go back on the losartan that he was taking for a while and then maybe restart a low-dose of the amlodipine at 2.5 mg he says he still has his old prescription at home.  That is perfectly fine to just take a step back and then start tracking those pressures.  We also had a long discussion today about how anxiety can  actually affect blood pressure as well.  He says he has been trying to exercise regularly mostly doing stretches because of his Achilles tendon-itis he has not been doing his cardio like he used to.  Okay to stop the omelsartan/amlodipine tab  Please start your old losartan 100 mg daily in the morning.  Take amlodipine 2.5 mg in the evening.  Track blood pressures over the next 10 to 14 days.  If blood pressures are still greater then 135 on average then try increasing the amlodipine to 5 mg at night and monitor blood pressure for an additional 10 to 14 days and then notify Dr. [place].      Relevant Medications   losartan (COZAAR) 100 MG tablet   amLODipine (NORVASC) 2.5 MG tablet     Other   Stress and adjustment reaction   Been under a lot of stress recently.  I offered to refer him for therapy/counseling.  His wife was here with him today and just encouraged him to think about it he is also not been sleeping as well and we discussed how stress can actually contribute to elevated blood pressure as well.  Him to reach out to his PCP if he decides that therapy/counseling would be helpful for him      Other Visit Diagnoses       Double vision          Double vision-did encourage him to schedule an appointment with his eye doctor for further evaluation.  I am not convinced that this is from the medication and we did discuss that today.   Return in about 3 weeks (around 01/13/2024) for PCP for BP .    Duaine German, MD

## 2023-12-23 NOTE — Telephone Encounter (Signed)
 Noted.

## 2023-12-23 NOTE — Progress Notes (Signed)
 Pt reports that on 4/2 he went to the ED because he was experiencing elevation in his BP. He followed up with his pcp on 4/10 and she changed his medications from Losartan 100 mg and Amlodipine 5 mg to Amlodipine-Olmesartan 5-40 mg and since that time he has been experiencing double vision

## 2024-01-24 ENCOUNTER — Other Ambulatory Visit: Payer: Self-pay

## 2024-01-24 DIAGNOSIS — M109 Gout, unspecified: Secondary | ICD-10-CM

## 2024-01-24 MED ORDER — ALLOPURINOL 100 MG PO TABS: 100.0000 mg | ORAL_TABLET | Freq: Every day | ORAL | 2 refills | Status: DC

## 2024-01-29 ENCOUNTER — Encounter: Payer: Self-pay | Admitting: Family Medicine

## 2024-01-29 ENCOUNTER — Ambulatory Visit: Admitting: Family Medicine

## 2024-01-29 VITALS — BP 160/80 | HR 88 | Temp 98.4°F | Resp 16 | Ht 73.0 in | Wt 208.0 lb

## 2024-01-29 DIAGNOSIS — R1013 Epigastric pain: Secondary | ICD-10-CM

## 2024-01-29 DIAGNOSIS — I10 Essential (primary) hypertension: Secondary | ICD-10-CM

## 2024-01-29 MED ORDER — AMLODIPINE BESYLATE 10 MG PO TABS: 10.0000 mg | ORAL_TABLET | Freq: Every day | ORAL | 1 refills | Status: DC

## 2024-01-29 NOTE — Assessment & Plan Note (Signed)
 Reporting better home BP readings but still upper 130's/80's and for the past few days SBP's 140's. Today we increased dose of Amlodipine  from 5 mg to 10 mg. Continue losartan  100 mg daily and low salt diet. Cardiology referral placed. Continue monitoring BP regularly.

## 2024-01-29 NOTE — Progress Notes (Signed)
 HPI: [name] is a 63 y.o. male with a PMHx significant for HTN, HLD, gout, and impaired fasting glucose, who is here today for chronic disease management.  Last seen on 12/19/2023 on video.   Hypertension:  Last visit we changed treatment to Amlodipine -Olmesartan  5-40 mg but discontinued on 12/23/23 after acute visit for elevated BP and double vision episode.  Currently on amlodipine  5 mg daily and losartan  100 mg daily.  BP readings at home: He says his BP averages ~135/75 with 3-4 readings under 130. He says it has been above 140 for the last few days due to some pain in his toe.  Side effects: none  He mentions he has had some recent episodes of epigastric abdominal pain radiating to his left-sided chest when he is stressed, most recently this morning. The episodes usually last about an hour. Denies associated palpitations, diaphoresis, SOB, or dizziness.  Negative for CP with exertion. Negative for changes in bowel habits or melena. GERD on Omeprazole  40 mg daily.  Lab Results  Component Value Date   CREATININE 1.20 06/12/2023   BUN 15 06/12/2023   NA 138 06/12/2023   K 4.2 06/12/2023   CL 106 06/12/2023   CO2 24 06/12/2023   Since his last visit he has been evaluated by his ophthalmologist due to episode of diplopia. 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  Denies any recent headaches.  Hyperlipidemia: Currently on atorvastatin  20 mg daily.  Side effects from medication: none Lab Results  Component Value Date   CHOL 151 06/12/2023   HDL 43.30 06/12/2023   LDLCALC 84 06/12/2023   TRIG 114.0 06/12/2023   CHOLHDL 3 06/12/2023   Toe pain/gout:  He has had toe pain for about 3 weeks. He reports some associated redness a few days ago. Currently on allopurinol  100 mg daily for gout and colchicine  0.6 mg daily prn. Also has a hx of arthritis.  Following with  ortho.  Review of Systems  Constitutional:  Negative for activity change, appetite change, diaphoresis and fever.  Respiratory:  Negative for cough and wheezing.   Gastrointestinal:  Negative for nausea and vomiting.  Endocrine: Negative for cold intolerance and heat intolerance.  Musculoskeletal:  Positive for arthralgias.  Skin:  Negative for rash.  Neurological:  Negative for syncope and facial asymmetry.  Psychiatric/Behavioral:  Negative for confusion and hallucinations. The patient is nervous/anxious.   See other pertinent positives and negatives in HPI.  Current Outpatient Medications on File Prior to Visit  Medication Sig Dispense Refill   albuterol  (VENTOLIN  HFA) 108 (90 Base) MCG/ACT inhaler Inhale 2 puffs into the lungs every 6 (six) hours as needed for wheezing or shortness of breath. 8 g 0   allopurinol  (ZYLOPRIM ) 100 MG tablet Take 1 tablet (100 mg total) by mouth daily. 90 tablet 2   atorvastatin  (LIPITOR) 20 MG tablet TAKE 1 TABLET BY MOUTH WITH SUPPER 90 tablet 3   cyclobenzaprine  (FLEXERIL ) 10 MG tablet Take 0.5-1 tablets (5-10 mg total) by mouth 3 (three) times daily as needed. 30 tablet 0   fluticasone  (FLONASE ) 50 MCG/ACT nasal spray Place 1 spray into both nostrils 2 (two) times daily. 16 g 0   losartan  (COZAAR ) 100 MG tablet Take 100 mg by mouth daily.     omeprazole  (PRILOSEC) 40 MG capsule Take 1 capsule (40 mg total) by mouth daily. 90 capsule 3  No current facility-administered medications on file prior to visit.   Past Medical History:  Diagnosis Date   Arthritis    Gout    Hyperlipidemia    Hypertension    Allergies  Allergen Reactions   Penicillins    Social History   Socioeconomic History   Marital status: Married    Spouse name: Not on file   Number of children: Not on file   Years of education: Not on file   Highest education level: Doctorate  Occupational History   Not on file  Tobacco Use   Smoking status: Never   Smokeless tobacco:  Never  Substance and Sexual Activity   Alcohol use: No    Alcohol/week: 0.0 standard drinks of alcohol   Drug use: No   Sexual activity: Not on file  Other Topics Concern   Not on file  Social History Narrative   Not on file   Social Drivers of Health   Financial Resource Strain: Low Risk  (08/26/2023)   Overall Financial Resource Strain (CARDIA)    Difficulty of Paying Living Expenses: Not hard at all  Food Insecurity: Low Risk  (12/11/2023)   Received from [facility]   Hunger Vital Sign    Worried About Running Out of Food in the Last Year: Never true    Ran Out of Food in the Last Year: Never true  Transportation Needs: No Transportation Needs (12/11/2023)   Received from [facility]    In the past 12 months, has lack of reliable transportation kept you from medical appointments, meetings, work or from getting things needed for daily living? : No  Physical Activity: Sufficiently Active (08/26/2023)   Exercise Vital Sign    Days of Exercise per Week: 5 days    Minutes of Exercise per Session: 30 min  Stress: No Stress Concern Present (08/26/2023)   [employer] of Occupational Health - Occupational Stress Questionnaire    Feeling of Stress : Only a little  Social Connections: Moderately Isolated (08/26/2023)   Social Connection and Isolation Panel [NHANES]    Frequency of Communication with Friends and Family: Once a week    Frequency of Social Gatherings with Friends and Family: Once a week    Attends Religious Services: 1 to 4 times per year    Active Member of [employer] or Organizations: No    Attends Banker Meetings: Not on file    Marital Status: Married   Vitals:   01/29/24 0847  BP: (!) 160/80  Pulse: 88  Resp: 16  Temp: 98.4 F (36.9 C)  SpO2: 98%   Body mass index is 27.44 kg/m.  Physical Exam Vitals and nursing note reviewed.  Constitutional:      General: He is not in acute distress.    Appearance: He is  well-developed.  HENT:     Head: Normocephalic and atraumatic.     Mouth/Throat:     Mouth: Mucous membranes are moist.     Pharynx: Oropharynx is clear. Uvula midline.  Eyes:     Conjunctiva/sclera: Conjunctivae normal.  Cardiovascular:     Rate and Rhythm: Normal rate and regular rhythm.     Pulses:          Dorsalis pedis pulses are 2+ on the right side and 2+ on the left side.     Heart sounds: No murmur heard. Pulmonary:     Effort: Pulmonary effort is normal. No respiratory distress.     Breath sounds:  Normal breath sounds.  Abdominal:     Palpations: Abdomen is soft. There is no hepatomegaly or mass.     Tenderness: There is no abdominal tenderness.  Musculoskeletal:     Right lower leg: No edema.     Left lower leg: No edema.  Lymphadenopathy:     Cervical: No cervical adenopathy.  Skin:    General: Skin is warm.     Findings: No erythema or rash.  Neurological:     Mental Status: He is alert and oriented to person, place, and time.     Cranial Nerves: No cranial nerve deficit.     Gait: Gait normal.  Psychiatric:        Mood and Affect: Affect normal. Mood is anxious.    ASSESSMENT AND PLAN:  Mr. Arredondo was seen today for chronic disease management.   Orders Placed This Encounter  Procedures   Ambulatory referral to Cardiology   EKG 12-Lead   Hypertension, essential, benign Assessment & Plan: Reporting better home BP readings but still upper 130's/80's and for the past few days SBP's 140's. Today we increased dose of Amlodipine  from 5 mg to 10 mg. Continue losartan  100 mg daily and low salt diet. Cardiology referral placed. Continue monitoring BP regularly.  Orders: -     amLODIPine  Besylate; Take 1 tablet (10 mg total) by mouth daily.  Dispense: 90 tablet; Refill: 1 -     EKG 12-Lead -     Ambulatory referral to Cardiology  Epigastric pain Radiated to left side of his chest. He has history of GERD, so epigastric pain could be certainly related to  dyspepsia. EKG today NSR, RBBB, T wave abnormalities on inferior leads. When compared with prior EKG done in 12/2023, no significant changes. He was clearly instructed about warning signs. Cardiology referral placed.  -     EKG 12-Lead  In regard to toe arthralgia/gout, he prefers to continue same dose of allopurinol  and colchicine . Follow-up with orthopedic.  Return if symptoms worsen or fail to improve, for keep next appointment/CPE.  I, Joseph Wheeler, acting as a scribe for Litha Lamartina [place], MD., have documented all relevant documentation on the behalf of Joseph Dahm [place], MD, as directed by  Joseph Glodowski [place], MD while in the presence of Joseph Stilley [place], MD.   I, Joseph Reindel [place], MD, have reviewed all documentation for this visit. The documentation on 01/29/24 for the exam, diagnosis, procedures, and orders are all accurate and complete.  Joseph Svehla G. [place], MD  [facility]. [place] office.

## 2024-01-29 NOTE — Patient Instructions (Addendum)
 A few things to remember from today's visit:  Hypertension, essential, benign - Plan: amLODipine  (NORVASC ) 10 MG tablet, EKG 12-Lead, Ambulatory referral to Cardiology  Epigastric pain - Plan: EKG 12-Lead Today amlodipine  increased from 5 mg to 10 mg. No changes in losartan . Appointment with cardiology will be arranged. Continue monitoring blood pressure.  If you need refills for medications you take chronically, please call your pharmacy. Do not use My Chart to request refills or for acute issues that need immediate attention. If you send a my chart message, it may take a few days to be addressed, specially if I am not in the office.  Please be sure medication list is accurate. If a new problem present, please set up appointment sooner than planned today.

## 2024-03-06 ENCOUNTER — Other Ambulatory Visit: Payer: Self-pay | Admitting: Family Medicine

## 2024-03-06 DIAGNOSIS — I10 Essential (primary) hypertension: Secondary | ICD-10-CM

## 2024-07-14 ENCOUNTER — Encounter: Payer: Self-pay | Admitting: Family Medicine

## 2024-07-23 ENCOUNTER — Other Ambulatory Visit: Payer: Self-pay | Admitting: Family Medicine

## 2024-07-23 DIAGNOSIS — I10 Essential (primary) hypertension: Secondary | ICD-10-CM

## 2024-10-14 ENCOUNTER — Other Ambulatory Visit: Payer: Self-pay | Admitting: Family Medicine

## 2024-10-14 DIAGNOSIS — M109 Gout, unspecified: Secondary | ICD-10-CM
